# Patient Record
Sex: Male | Born: 1960 | Race: White | Hispanic: No | Marital: Married | Smoking: Never smoker
Health system: Southern US, Community
[De-identification: ages and names within clinical notes are randomized; demographics above are authoritative.]

## PROBLEM LIST (undated history)

## (undated) ENCOUNTER — Emergency Department (HOSPITAL_BASED_OUTPATIENT_CLINIC_OR_DEPARTMENT_OTHER): Admission: EM | Payer: Self-pay | Source: Home / Self Care

## (undated) DIAGNOSIS — M25569 Pain in unspecified knee: Secondary | ICD-10-CM

## (undated) DIAGNOSIS — N200 Calculus of kidney: Secondary | ICD-10-CM

## (undated) DIAGNOSIS — R350 Frequency of micturition: Secondary | ICD-10-CM

## (undated) DIAGNOSIS — M79609 Pain in unspecified limb: Secondary | ICD-10-CM

## (undated) DIAGNOSIS — G4730 Sleep apnea, unspecified: Secondary | ICD-10-CM

## (undated) DIAGNOSIS — M67979 Unspecified disorder of synovium and tendon, unspecified ankle and foot: Secondary | ICD-10-CM

## (undated) DIAGNOSIS — E785 Hyperlipidemia, unspecified: Secondary | ICD-10-CM

## (undated) DIAGNOSIS — Z87442 Personal history of urinary calculi: Secondary | ICD-10-CM

## (undated) DIAGNOSIS — Z8719 Personal history of other diseases of the digestive system: Secondary | ICD-10-CM

## (undated) DIAGNOSIS — I499 Cardiac arrhythmia, unspecified: Secondary | ICD-10-CM

## (undated) DIAGNOSIS — I10 Essential (primary) hypertension: Secondary | ICD-10-CM

## (undated) HISTORY — PX: ANKLE SURGERY: SHX546

## (undated) HISTORY — DX: Pain in unspecified knee: M25.569

## (undated) HISTORY — DX: Frequency of micturition: R35.0

## (undated) HISTORY — DX: Pain in unspecified limb: M79.609

## (undated) HISTORY — DX: Hyperlipidemia, unspecified: E78.5

## (undated) HISTORY — DX: Calculus of kidney: N20.0

## (undated) HISTORY — PX: REPLACEMENT TOTAL KNEE BILATERAL: SUR1225

## (undated) HISTORY — DX: Cardiac arrhythmia, unspecified: I49.9

---

## 1998-11-09 ENCOUNTER — Ambulatory Visit (HOSPITAL_COMMUNITY): Admission: RE | Admit: 1998-11-09 | Discharge: 1998-11-09 | Payer: Self-pay | Admitting: Physical Therapy

## 1998-12-18 HISTORY — PX: HERNIA REPAIR: SHX51

## 2000-12-18 HISTORY — PX: LITHOTRIPSY: SUR834

## 2000-12-18 HISTORY — PX: KNEE ARTHROSCOPY W/ ACL RECONSTRUCTION: SHX1858

## 2002-01-14 ENCOUNTER — Ambulatory Visit (HOSPITAL_BASED_OUTPATIENT_CLINIC_OR_DEPARTMENT_OTHER): Admission: RE | Admit: 2002-01-14 | Discharge: 2002-01-15 | Payer: Self-pay | Admitting: Orthopaedic Surgery

## 2002-02-10 ENCOUNTER — Ambulatory Visit (HOSPITAL_COMMUNITY): Admission: RE | Admit: 2002-02-10 | Discharge: 2002-02-10 | Payer: Self-pay | Admitting: Gastroenterology

## 2003-05-06 ENCOUNTER — Ambulatory Visit (HOSPITAL_COMMUNITY): Admission: AD | Admit: 2003-05-06 | Discharge: 2003-05-06 | Payer: Self-pay | Admitting: Urology

## 2011-06-20 ENCOUNTER — Encounter: Payer: Self-pay | Admitting: Vascular Surgery

## 2011-06-27 ENCOUNTER — Encounter (INDEPENDENT_AMBULATORY_CARE_PROVIDER_SITE_OTHER): Payer: PRIVATE HEALTH INSURANCE

## 2011-06-27 ENCOUNTER — Encounter (INDEPENDENT_AMBULATORY_CARE_PROVIDER_SITE_OTHER): Payer: PRIVATE HEALTH INSURANCE | Admitting: Vascular Surgery

## 2011-06-27 DIAGNOSIS — M79609 Pain in unspecified limb: Secondary | ICD-10-CM

## 2011-06-27 DIAGNOSIS — I8310 Varicose veins of unspecified lower extremity with inflammation: Secondary | ICD-10-CM

## 2011-06-27 DIAGNOSIS — I83893 Varicose veins of bilateral lower extremities with other complications: Secondary | ICD-10-CM

## 2011-06-27 NOTE — Consult Note (Signed)
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  He now complains of severe aching, throbbing, burning discomfort in the thigh and extending down the calf which worsens the longer he is on his feet.  These symptoms have become quite severe and are limiting his ability to do things with his kids and to be on his feet for long periods of time.  He has swelling as the day progresses as noted by a band just above his sock line but he has no history of thrombophlebitis or deep vein thrombosis, bleeding or ulceration.  He has not worn elastic compression stockings, tried elevation of the legs or taking pain medication.  CHRONIC MEDICAL PROBLEMS:  Which are stable: 1. Hyperlipidemia. 2. Renal calculi. 3. History of ACL repair left leg. 4. Possible sleep apnea. 5. Negative for coronary artery disease, diabetes, COPD or stroke.  SOCIAL HISTORY:  He is married.  He is a Psychologist, sport and exercise.  Does not use tobacco.  Drinks occasional alcohol.  FAMILY HISTORY:  Positive for emphysema in his father who was a heavy smoker.  Negative for coronary artery disease, diabetes and stroke.  REVIEW OF SYSTEMS:  Positive for headaches, knee pain, leg discomfort with walking, occasional palpitations, has occasional intermittent chest discomfort, has been seen by Dr. Corliss Marcus in the past, ADD, reflux esophagitis and urinary frequency.  All other systems are negative in complete review of systems.  PHYSICAL EXAMINATION:  Vital signs:  Blood pressure 142/108, heart rate 74, respiratory rate 24.  General:  He is a well-developed, well- nourished male in no apparent distress, alert and oriented x3.  HEENT: Exam normal for age.  EOMs intact.  Lungs:  Clear to  auscultation.  No rhonchi or wheezing.  Cardiovascular:  Regular rhythm.  No murmurs. Abdomen:  Obese.  No palpable masses.  Musculoskeletal:  Free of major deformities.  Neurological:  Normal.  Skin:  Free of rashes.  Lower extremities:  Exam reveals 3+ femoral and dorsalis pedis pulses bilaterally.  Left leg has bulging varicosities in the distal thigh extending into the medial calf and the posterior calf and also in the lower third of the calf of the great saphenous system.  He has 1+ edema bilaterally.  There are a few small varicosities on the right leg in the calf area of the great saphenous system.  Today I ordered bilateral venous duplex exam which I reviewed and interpreted.  He has gross reflux in the left great saphenous system from the junction to the knee supplying these varicosities.  The right leg has a small area of reflux in the mid thigh.  There is some reflux in the deep system bilaterally.  This patient has symptomatic varicose veins particularly in his left leg which are affecting his daily living and ability to work and interact with his kids.  We will treat him initially with long-leg elastic compression stockings (20 mm - 30 mm gradient) as well as elevation and ibuprofen.  He will return in 3 months.  If there has not been significant improvement we should proceed with laser ablation of his left great saphenous  vein with greater than 20 stab phlebectomies.    Quita Skye Hart [place], M.D. Electronically Signed  JDL/MEDQ  D:  06/27/2011  T:  06/27/2011  Job:  [id number]  cc:   Caryn Bee L. Little, M.D.

## 2011-07-05 NOTE — Procedures (Unsigned)
LOWER EXTREMITY VENOUS REFLUX EXAM  INDICATION:  Bilateral varicose veins.  EXAM:  Using color-flow imaging and pulse Doppler spectral analysis, the right and left common femoral, superficial femoral, popliteal, posterior tibial, greater and lesser saphenous veins are evaluated.  There is evidence suggesting deep venous insufficiency in the right and left lower extremity.  The left saphenofemoral junction is not competent with Reflux of >513milliseconds. The right saphenofemoral junction is competent.  The right and left GSV's are not competent with Reflux of >575milliseconds with the caliber as described below.)  The right and left proximal short saphenous veins demonstrate competency.  GSV Diameter (used if found to be incompetent only)                                                  Right     Left Proximal Greater Saphenous Vein                  0.49 cm   0.49 cm Proximal-to-mid-thigh                            0.39 cm   0.54 cm Mid thigh                                        0.37 cm   0.52 cm Mid-distal thigh Distal thigh                                     0.40 cm   0.71 cm Knee                                             0.37 cm   0.47 cm  IMPRESSION: 1. The right and left greater saphenous veins are not competent with     reflux >580milliseconds. 2. The right greater saphenous vein is tortuous.  The left greater     saphenous vein is not tortuous. 3. The deep venous system is not competent with Reflux of     >555milliseconds. 4. The right and left small saphenous vein is competent.  ___________________________________________ Quita Skye. Hart [place], M.D.  EM/MEDQ  D:  06/27/2011  T:  06/27/2011  Job:  [id number]

## 2011-08-02 ENCOUNTER — Encounter: Payer: Self-pay | Admitting: Cardiovascular Disease

## 2011-08-17 ENCOUNTER — Encounter: Payer: Self-pay | Admitting: Vascular Surgery

## 2011-09-22 ENCOUNTER — Encounter: Payer: Self-pay | Admitting: Vascular Surgery

## 2011-10-02 ENCOUNTER — Ambulatory Visit: Payer: PRIVATE HEALTH INSURANCE | Admitting: Vascular Surgery

## 2011-10-09 ENCOUNTER — Ambulatory Visit (INDEPENDENT_AMBULATORY_CARE_PROVIDER_SITE_OTHER): Payer: PRIVATE HEALTH INSURANCE | Admitting: Vascular Surgery

## 2011-10-09 ENCOUNTER — Encounter: Payer: Self-pay | Admitting: Vascular Surgery

## 2011-10-09 VITALS — BP 149/101 | HR 89 | Resp 16 | Ht 71.0 in | Wt 281.5 lb

## 2011-10-09 DIAGNOSIS — I83893 Varicose veins of bilateral lower extremities with other complications: Secondary | ICD-10-CM

## 2011-10-09 NOTE — Progress Notes (Signed)
Subjective:     Patient ID: Devon Griffith, male   DOB: 01-May-1961, 50 y.o.   MRN: [id number]  HPI this 50 year old male patient returns today for further followup regarding his severe venous insufficiency of the left leg. He has bulging varicosities which cause aching throbbing and burning discomfort these symptoms have not been relieved by long leg elastic compression stockings, elevation, and ibuprofen on a daily basis. He continues to have swelling in the left ankle and lower calf as the day progresses. He has no history of DVT, thrombophlebitis, stasis ulcers, or bleeding. His symptoms continued to worsen and make it more difficult for him to perform his daily activities.  Past Medical History  Diagnosis Date  . Hyperlipidemia   . Headaches, cluster   . Knee pain   . Pain in limb   . GERD (gastroesophageal reflux disease)   . Urinary frequency   . Kidney stones   . ADD (attention deficit disorder)     History  Substance Use Topics  . Smoking status: Never Smoker   . Smokeless tobacco: Not on file  . Alcohol Use: 4.0 oz/week    8 drink(s) per week    Family History  Problem Relation Age of Onset  . Emphysema Father   . Cancer Mother     No Known Allergies  Current outpatient prescriptions:diltiazem (CARDIZEM CD) 120 MG 24 hr capsule, Take 120 mg by mouth daily.  , Disp: , Rfl: ;  simvastatin (ZOCOR) 20 MG tablet, Take 40 mg by mouth at bedtime.  , Disp: , Rfl:   BP 149/101  Pulse 89  Resp 16  Ht 5\' 11"  (1.803 m)  Wt 281 lb 8 oz (127.688 kg)  BMI 39.26 kg/m2  Body mass index is 39.26 kg/(m^2).        Review of Systems he denies chest pain, dyspnea on exertion, PND, orthopnea, hemoptysis, or claudication. All other systems are negative and complete review of systems    Objective:   Physical Exam blood pressure 149/101 heart rate 89 respirations 16 HEENT normal for age Lungs no rhonchi or wheezing Abdomen soft nontender with no masses Lower extremity has 3+  femoral dorsalis pedis pulse palpable. There is 1+ edema. There are bulging varicosities in the distal thigh and medial calf extending slightly posteriorly in the distal thigh. No hyperpigmentation or ulceration noted.    Assessment:    severe venous insufficiency left leg with gross reflux left great saphenous vein causing venous hypertension and symptomatic varicosities. His are affecting his daily living .    Plan:    patient needs laser ablation of left great saphenous vein with greater than 20 stab phlebectomy at same procedure. Has not been improved by conservative regimen we'll proceed with precertification to perform this in the near future.

## 2011-11-08 ENCOUNTER — Other Ambulatory Visit: Payer: Self-pay | Admitting: Family Medicine

## 2011-11-08 ENCOUNTER — Ambulatory Visit
Admission: RE | Admit: 2011-11-08 | Discharge: 2011-11-08 | Disposition: A | Discharge status: Home / Self Care | Payer: BC Managed Care – PPO | Source: Ambulatory Visit | Attending: Family Medicine | Admitting: Family Medicine

## 2011-11-08 DIAGNOSIS — R109 Unspecified abdominal pain: Secondary | ICD-10-CM

## 2011-11-14 ENCOUNTER — Other Ambulatory Visit: Payer: Self-pay | Admitting: Urology

## 2011-11-28 ENCOUNTER — Encounter (HOSPITAL_COMMUNITY): Payer: Self-pay | Admitting: *Deleted

## 2011-11-28 NOTE — Progress Notes (Signed)
Pt instructed to come to [facility] short stay at 0800 on 11/30/11. Instructed in free valet parking. No aspirin or NSAIDS. Pt takes all home meds in evening; therefore, no meds required AM of procedure. No vitamins or herbs. Fill out blue folder and bring the day of the procedure. Follow instructions for laxative the day before the procedure. To bring the day of procedure: blue folder, insurance card, photo ID, and responsible driver. Pt knows he has to have responsible person with him for 24 hours after procedure.

## 2011-11-29 ENCOUNTER — Encounter (HOSPITAL_COMMUNITY): Payer: Self-pay | Admitting: Pharmacy Technician

## 2011-11-29 ENCOUNTER — Other Ambulatory Visit (HOSPITAL_COMMUNITY): Payer: Self-pay | Admitting: *Deleted

## 2011-11-30 ENCOUNTER — Encounter (HOSPITAL_COMMUNITY)
Admission: RE | Disposition: A | Discharge status: Home / Self Care | Payer: Self-pay | Source: Ambulatory Visit | Attending: Urology

## 2011-11-30 ENCOUNTER — Encounter (HOSPITAL_COMMUNITY): Payer: Self-pay | Admitting: *Deleted

## 2011-11-30 ENCOUNTER — Ambulatory Visit (HOSPITAL_COMMUNITY): Payer: BC Managed Care – PPO

## 2011-11-30 ENCOUNTER — Ambulatory Visit (HOSPITAL_COMMUNITY)
Admission: RE | Admit: 2011-11-30 | Discharge: 2011-11-30 | Disposition: A | Discharge status: Home / Self Care | Payer: BC Managed Care – PPO | Source: Ambulatory Visit | Attending: Urology | Admitting: Urology

## 2011-11-30 DIAGNOSIS — K219 Gastro-esophageal reflux disease without esophagitis: Secondary | ICD-10-CM | POA: Insufficient documentation

## 2011-11-30 DIAGNOSIS — I10 Essential (primary) hypertension: Secondary | ICD-10-CM | POA: Insufficient documentation

## 2011-11-30 DIAGNOSIS — Z79899 Other long term (current) drug therapy: Secondary | ICD-10-CM | POA: Insufficient documentation

## 2011-11-30 DIAGNOSIS — R3915 Urgency of urination: Secondary | ICD-10-CM | POA: Insufficient documentation

## 2011-11-30 DIAGNOSIS — N201 Calculus of ureter: Secondary | ICD-10-CM | POA: Insufficient documentation

## 2011-11-30 DIAGNOSIS — E785 Hyperlipidemia, unspecified: Secondary | ICD-10-CM | POA: Insufficient documentation

## 2011-11-30 DIAGNOSIS — R112 Nausea with vomiting, unspecified: Secondary | ICD-10-CM | POA: Insufficient documentation

## 2011-11-30 DIAGNOSIS — R350 Frequency of micturition: Secondary | ICD-10-CM | POA: Insufficient documentation

## 2011-11-30 DIAGNOSIS — Z538 Procedure and treatment not carried out for other reasons: Secondary | ICD-10-CM | POA: Insufficient documentation

## 2011-11-30 DIAGNOSIS — G4730 Sleep apnea, unspecified: Secondary | ICD-10-CM | POA: Insufficient documentation

## 2011-11-30 DIAGNOSIS — R109 Unspecified abdominal pain: Secondary | ICD-10-CM | POA: Insufficient documentation

## 2011-11-30 SURGERY — LITHOTRIPSY, ESWL
Anesthesia: LOCAL | Laterality: Right

## 2011-11-30 MED ORDER — DIPHENHYDRAMINE HCL 25 MG PO CAPS
25.0000 mg | ORAL_CAPSULE | ORAL | Status: AC
  Administered 2011-11-30: 25 mg via ORAL

## 2011-11-30 MED ORDER — DEXTROSE-NACL 5-0.45 % IV SOLN
INTRAVENOUS | Status: DC
  Administered 2011-11-30: 09:00:00 via INTRAVENOUS

## 2011-11-30 MED ORDER — CIPROFLOXACIN HCL 500 MG PO TABS
500.0000 mg | ORAL_TABLET | ORAL | Status: AC
  Administered 2011-11-30: 500 mg via ORAL

## 2011-11-30 MED ORDER — DIAZEPAM 5 MG PO TABS
10.0000 mg | ORAL_TABLET | ORAL | Status: AC
  Administered 2011-11-30: 10 mg via ORAL

## 2011-11-30 NOTE — Progress Notes (Signed)
To Litho truck at this time

## 2011-11-30 NOTE — Op Note (Signed)
Pre-operative diagnosis : R ureteral stone  Postoperative diagnosis: passed R ureteral stone  Operation: Discontinued lithotripsy, unable to visualize stone, with IV contrast  Surgeon: Kathyann Spaugh  Anesthesia: pre-op sedation  Preparation: After pre-med, the pt is brought to the lithotripter, and placed upon the table in the supine position. CT is reviewed and KUB from today is reviewed.  Review history: 3 week hx of 6mm stone found on CT, and scheduled for lithotripsy. Resolved renal colic. CT and KUB reviewed.   Statement of  Likelihood of Success: Excellent. TIME-OUT observed.:  Procedure: Stone not identified on KUB today, and CT reviewed. Note clinical symptoms resolved, although pt has not strained urine, and has not noted stone passage. IV contrast given, and contrast reveals normal calabre ureter. No stone is seen. Contrast seen from the calyces to the bladder.    Procedure therefore stopped.

## 2011-11-30 NOTE — Progress Notes (Signed)
Litho aborted unable to view stone . D/c to

## 2011-11-30 NOTE — H&P (Signed)
Urology Admission H&P  Chief Complaint: R flank pain  History of Present Illness: 3 week hx of R flank pain , intermittant severe nausea and vomiting. CT shows R proximal mm stone.Nocturia x 8 with constant urge.  Past Medical History  Diagnosis Date  . Hyperlipidemia   . Knee pain   . Pain in limb   . GERD (gastroesophageal reflux disease)   . Urinary frequency   . Kidney stones   . ADD (attention deficit disorder)   . Headaches, cluster    Past Surgical History  Procedure Date  . Hernia repair 2000  . Knee arthroscopy w/ acl reconstruction 2002    left knee  . Lithotripsy 2002    Home Medications:  Prescriptions prior to admission  Medication Sig Dispense Refill  . HYDROcodone-acetaminophen (NORCO) 5-325 MG per tablet Take 1 tablet by mouth every 4 (four) hours as needed. PAIN       . diltiazem (CARDIZEM CD) 120 MG 24 hr capsule Take 120 mg by mouth every morning.       . simvastatin (ZOCOR) 20 MG tablet Take 20 mg by mouth at bedtime.        Allergies: No Known Allergies  Family History  Problem Relation Age of Onset  . Emphysema Father   . Cancer Mother    Social History:  reports that he has never smoked. He does not have any smokeless tobacco history on file. He reports that he drinks about 4 ounces of alcohol per week. He reports that he does not use illicit drugs.  Review of Systems  Constitutional: Negative.   HENT: Negative.   Eyes: Negative.   Respiratory: Negative.   Cardiovascular: Negative.   Gastrointestinal: Positive for nausea and vomiting. Negative for heartburn, abdominal pain, diarrhea, constipation and melena.  Genitourinary: Positive for urgency, frequency and flank pain. Negative for dysuria and hematuria.  Skin: Negative.   Neurological: Negative.   Endo/Heme/Allergies: Negative.   Psychiatric/Behavioral: Negative.     Physical Exam:  Vital signs in last 24 hours: Temp:  [97.3 F (36.3 C)] 97.3 F (36.3 C) (12/13 0814) Pulse Rate:   [71] 71  (12/13 0814) Resp:  [18] 18  (12/13 0814) BP: (138)/(96) 138/96 mmHg (12/13 0814) SpO2:  [94 %] 94 % (12/13 0814) Weight:  [124.739 kg (275 lb)] 275 lb (124.739 kg) (12/13 1610) Physical Exam  Constitutional: He appears well-developed and well-nourished.  GI: Soft. Bowel sounds are normal. There is no splenomegaly or hepatomegaly. There is generalized tenderness. There is CVA tenderness. There is no rigidity, no rebound, no guarding, no tenderness at McBurney's point and negative Murphy's sign. No hernia. Hernia confirmed negative in the ventral area, confirmed negative in the right inguinal area and confirmed negative in the left inguinal area.  Genitourinary: Testes normal and penis normal.    Laboratory Data:  No results found for this or any previous visit (from the past 24 hour(s)). No results found for this or any previous visit (from the past 240 hour(s)). Creatinine: No results found for this basename: CREATININE:7 in the last 168 hours Baseline Creatinine:   Impression/Assessment:  Right upper ureteral stone with colic  Plan:  Lithotripsy R ureteral stone  Hillel Card I 11/30/2011, 10:11 AM

## 2011-12-19 HISTORY — PX: VARICOSE VEIN SURGERY: SHX832

## 2012-01-29 ENCOUNTER — Other Ambulatory Visit: Payer: Self-pay | Admitting: *Deleted

## 2012-01-29 DIAGNOSIS — I83893 Varicose veins of bilateral lower extremities with other complications: Secondary | ICD-10-CM

## 2012-02-26 ENCOUNTER — Other Ambulatory Visit: Payer: BC Managed Care – PPO | Admitting: Vascular Surgery

## 2012-03-01 ENCOUNTER — Encounter: Payer: Self-pay | Admitting: Vascular Surgery

## 2012-03-04 ENCOUNTER — Encounter: Payer: Self-pay | Admitting: Vascular Surgery

## 2012-03-04 ENCOUNTER — Ambulatory Visit (INDEPENDENT_AMBULATORY_CARE_PROVIDER_SITE_OTHER): Payer: BC Managed Care – PPO | Admitting: Vascular Surgery

## 2012-03-04 VITALS — BP 128/90 | HR 67 | Resp 18 | Ht 71.0 in | Wt 268.0 lb

## 2012-03-04 DIAGNOSIS — I83893 Varicose veins of bilateral lower extremities with other complications: Secondary | ICD-10-CM | POA: Insufficient documentation

## 2012-03-04 NOTE — Progress Notes (Signed)
Subjective:     Patient ID: Devon Griffith, male   DOB: [birth year], 51 y.o.   MRN: [id number]  HPI this 51 year old male with laser ablation of left great saphenous vein from the knee to the saphenofemoral junction under local tumescent anesthesia and greater than 20 stab phlebectomy for secondary varicosities. He tolerated the procedure well.  Review of Systems     Objective:   Physical Exam BP 128/90  Pulse 67  Resp 18  Ht 5\' 11"  (1.803 m)  Wt 268 lb (121.564 kg)  BMI 37.38 kg/m2    Assessment:     Well-tolerated laser ablation left great saphenous vein with greater than 20 stab phlebectomy for painful varicosities    Plan:     Return in one week for venous duplex exam to confirm closure left great saphenous vein.

## 2012-03-04 NOTE — Progress Notes (Signed)
Laser Ablation Procedure      Date: 03/04/2012    Devon Griffith DOB:[birth year]  Consent signed: Yes  Surgeon:J.D. Hart Rochester  Procedure: Laser Ablation: left Greater Saphenous Vein  BP 128/90  Pulse 67  Resp 18  Ht 5\' 11"  (1.803 m)  Wt 268 lb (121.564 kg)  BMI 37.38 kg/m2  Start time: 9:00   End time: 10:20  Tumescent Anesthesia: 425 cc 0.9% NaCl with 50 cc Lidocaine HCL with 1% Epi and 15 cc 8.4% NaHCO3  Local Anesthesia: 14 cc Lidocaine HCL and NaHCO3 (ratio 2:1)  Pulsed mode: Watts 15 Seconds 1 Pulses:1 Total Pulses:126 Total Energy: 1890 Total Time: 2:06   Stab Phlebectomy: >20 Sites: Thigh, Calf and Ankle  Patient tolerated procedure well: Yes  Description of Procedure:  After marking the course of the saphenous vein and the secondary varicosities in the standing position, the patient was placed on the operating table in the supine position, and the left leg was prepped and draped in sterile fashion. Local anesthetic was administered, and under ultrasound guidance the saphenous vein was accessed with a micro needle and guide wire; then the micro puncture sheath was placed. A guide wire was inserted to the saphenofemoral junction, followed by a 5 french sheath.  The position of the sheath and then the laser fiber below the junction was confirmed using the ultrasound and visualization of the aiming beam.  Tumescent anesthesia was administered along the course of the saphenous vein using ultrasound guidance. Protective laser glasses were placed on the patient, and the laser was fired at 15 watt pulsed mode advancing 1-2 mm per sec.  For a total of 1890 joules.  A steri strip was applied to the puncture site.  The patient was then put into Trendelenburg position.  Local anesthetic was utilized overlying the marked varicosities.  Greater than 20 stab wounds were made using the tip of an 11 blade; and using the vein hook,  The phlebectomies were performed using a hemostat to avulse these  varicosities.  Adequate hemostasis was achieved, and steri strips were applied to the stab wound.    ABD pads and thigh high compression stockings were applied.  Ace wrap bandages were applied over the phlebectomy sites and at the top of the saphenofemoral junction.  Blood loss was less than 15 cc.  The patient ambulated out of the operating room having tolerated the procedure well.

## 2012-03-05 ENCOUNTER — Encounter: Payer: Self-pay | Admitting: Vascular Surgery

## 2012-03-05 ENCOUNTER — Telehealth: Payer: Self-pay | Admitting: *Deleted

## 2012-03-05 ENCOUNTER — Ambulatory Visit: Payer: BC Managed Care – PPO | Admitting: Vascular Surgery

## 2012-03-05 NOTE — Telephone Encounter (Signed)
Patient doing well. No bleeding. No complaints of pain just that the ace wraps are tight. Discussed those and reminded him of his fu visit next week.

## 2012-03-11 ENCOUNTER — Encounter: Payer: Self-pay | Admitting: Vascular Surgery

## 2012-03-12 ENCOUNTER — Ambulatory Visit (INDEPENDENT_AMBULATORY_CARE_PROVIDER_SITE_OTHER): Payer: BC Managed Care – PPO | Admitting: Vascular Surgery

## 2012-03-12 ENCOUNTER — Encounter (INDEPENDENT_AMBULATORY_CARE_PROVIDER_SITE_OTHER): Payer: BC Managed Care – PPO | Admitting: *Deleted

## 2012-03-12 ENCOUNTER — Encounter: Payer: Self-pay | Admitting: Vascular Surgery

## 2012-03-12 VITALS — BP 123/85 | HR 74 | Resp 16 | Ht 71.0 in | Wt 268.0 lb

## 2012-03-12 DIAGNOSIS — I83893 Varicose veins of bilateral lower extremities with other complications: Secondary | ICD-10-CM

## 2012-03-12 DIAGNOSIS — Z48812 Encounter for surgical aftercare following surgery on the circulatory system: Secondary | ICD-10-CM

## 2012-03-12 NOTE — Progress Notes (Signed)
Subjective:     Patient ID: Devon Griffith, male   DOB: [birth year], 51 y.o.   MRN: [id number]  HPI this 51 year old male returns 1 week post laser ablation left great saphenous vein with greater than 20 stab phlebectomy for painful varicosities do 2 valvular incompetence and reflux in the left great saphenous system. He tolerated the procedure well. He said some mild-to-moderate discomfort along the course of the great saphenous vein particularly in the proximal thigh as one would expect. He said no distal edema in no pain at the stab phlebectomy sites. He took ibuprofen and work elastic compression stocking.  Review of Systems     Objective:   Physical ExamBP 123/85  Pulse 74  Resp 16  Ht 5\' 11"  (1.803 m)  Wt 268 lb (121.564 kg)  BMI 37.38 kg/m2  General well-developed well-nourished male in no apparent stress Left leg with minimal erythema along the course of the great saphenous vein in the thigh. Mild tenderness along the great saphenous vein up to the saphenofemoral junction. No distal edema noted. Steps phlebectomy sites are healing well.   Today I ordered a venous duplex exam of the left leg which are reviewed and interpreted. There is successful closure left great saphenous vein from 2 cm to the junction throughout the leg. No DVT is noted.     Assessment:     Successful ablation left great saphenous vein with multiple stab phlebectomy for painful varicosities    Plan:     Will wear elastic compression stockings for one more week and then return to see [place] on when necessary basis

## 2012-03-19 NOTE — Procedures (Unsigned)
DUPLEX DEEP VENOUS EXAM - LOWER EXTREMITY  INDICATION:  One week status post ablation of left GSV  HISTORY:  Edema:  No Trauma/Surgery:  EVLT Pain:  Yes PE:  No Previous DVT:  No Anticoagulants: Other:  DUPLEX EXAM:               CFV   SFV   PopV  PTV    GSV               R  L  R  L  R  L  R   L  R  L Thrombosis       o     o     o      o     + Spontaneous      +     +     +            o Phasic           +     +     +            o Augmentation     +     +     +      +     o Compressible     +     +     +      +     o Competent  Legend:  + - yes  o - no  p - partial  D - decreased  IMPRESSION: 1. Successful ablation of left great saphenous vein approximately 1.9     cm distal to the junction through the insertion point. 2. No deep vein involvement was observed.   _____________________________ Quita Skye. Hart [place], M.D.  LT/MEDQ  D:  03/12/2012  T:  03/12/2012  Job:  [id number]

## 2012-03-29 ENCOUNTER — Other Ambulatory Visit: Payer: Self-pay | Admitting: Urology

## 2012-03-29 ENCOUNTER — Encounter (HOSPITAL_COMMUNITY): Payer: Self-pay | Admitting: *Deleted

## 2012-03-29 NOTE — H&P (Signed)
History of Present Illness     51 y/o white male with h/o nephrolithiasis presents for acute evaluation of RLQ abdominal pain and diffisulty voiding.  He was scheduled for Rt ESWL on 11/30/11 for right proximal ureteral stone, but stone could not be visualized.  The stone was not visualized on f/u KUB in the office 12/07/12 and he had not had further pains so it was felt that he had likely passed the stone.  He had been getting along well until Monday 03/25/12 when he developed N/V, diarrhea and diffuse abdominal discomfort.  His wife had just gotten over a GI bug so he assumed that he had the same.  That evening, he felt lousy and had some low grade fever and chills.  He did not have any further N/V/D and the fever/chills had resolved by morning.  He has not had further fever, chills, N/V/D but has continued with anorexia and RLQ abdominal discomfort.  He has also noticed a weaker FOS, hesitancy despite urgency and some burning in his penis occasionally.  No dysuria or gross hematuria.    He has had prior ESWL x 3.  He has had a couple of stents as well but back in the 1990's.  He had a right ureterolithotomy at age 62.  He has had basket extractions as well.     Past Medical History Problems  1. History of  Adult Sleep Apnea 780.57 2. History of  Allergic Rhinitis 477.9 3. History of  Dyslipidemia 272.4 4. History of  Hypertension 401.9 5. History of  Nephrolithiasis V13.01 6. History of  Nephrolithiasis V13.01 7. History of  Obstructive Sleep Apnea 327.23  Surgical History Problems  1. History of  Complete Colonoscopy 2. History of  Cystoscopy With Insertion Of Ureteral Stent 3. History of  Cystoscopy With Manipulation Of Ureteral Calculus 4. History of  Cystoscopy With Ureteroscopy 5. History of  Inguinal Hernia Repair Bilateral 6. History of  Kidney Surgery 7. History of  Lithotripsy 8. History of  Primary Repair Of Knee Ligament Cruciate Anterior 9. History of  Primary Repair Of Knee  Ligament Cruciate Anterior 10. History of  Varicose Vein Ligation Left  Current Meds 1. Diltiazem HCl 60 MG Oral Tablet; Therapy: 26Nov2012 to 2. Simvastatin TABS; Therapy: (Recorded:26Nov2012) to 3. Uribel 118 MG Oral Capsule; Therapy: (Recorded:21Dec2012) to 4. VESIcare TABS; Therapy: (Recorded:21Dec2012) to  Allergies Medication  1. No Known Drug Allergies  Family History Problems  1. Maternal history of  Breast Cancer V16.3 2. Family history of  Death In The Family Father Died age 38 Emphysema 3. Family history of  Death In The Family Mother Died cancer of thyroid age 70 3. Paternal history of  Emphysema 5. Family history of  Family Health Status Number Of Children 2 sosn 6. Maternal history of  Lung Cancer V16.1 7. Maternal history of  Metastasis To Liver 8. Maternal history of  Thyroid Cancer  Social History Problems  1. Being A Social Drinker less than 1 per day 2. Caffeine Use 2 a week 3. Marital History - Currently Married 4. 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 History of  Tobacco Use 305.1  Review of Systems Genitourinary, constitutional, skin, eye, otolaryngeal, hematologic/lymphatic, cardiovascular, pulmonary, endocrine, musculoskeletal, gastrointestinal, neurological and psychiatric system(s) were reviewed and pertinent findings if present are noted.  Genitourinary: urinary frequency, feelings of urinary urgency, difficulty starting the urinary stream, weak urinary stream and penile pain.  Gastrointestinal: nausea and abdominal pain.    Vitals  Vital Signs [Data Includes: Last 1 Day]  12Apr2013 09:20AM  Blood Pressure: 119 / 86 Temperature: 98.4 F Heart Rate: 71  Physical Exam Constitutional: Well nourished and well developed . No acute distress.  ENT:. The ears and nose are normal in appearance.  Neck: The appearance of the neck is normal and no neck mass is present.  Pulmonary: No respiratory distress and normal respiratory rhythm  and effort.  Cardiovascular:. No peripheral edema.  Abdomen: The abdomen is not distended. The abdomen is soft and nontender. No CVA tenderness.  Skin: Normal skin turgor, no visible rash and no visible skin lesions.  Neuro/Psych:. Mood and affect are appropriate.    Results/Data Urine [Data Includes: Last 1 Day]   12Apr2013  COLOR GREEN   APPEARANCE CLEAR   SPECIFIC GRAVITY 1.025   pH 5.5   GLUCOSE NEG mg/dL  BILIRUBIN SMALL   KETONE NEG mg/dL  BLOOD LARGE   PROTEIN TRACE mg/dL  UROBILINOGEN 0.2 mg/dL  NITRITE NEG   LEUKOCYTE ESTERASE NEG   SQUAMOUS EPITHELIAL/HPF RARE   WBC 4-6 WBC/hpf  RBC 0-3 RBC/hpf  BACTERIA FEW   CRYSTALS NONE SEEN   CASTS Hyaline casts noted   Other Mucus noted.    The following images/tracing/specimen were independently visualized: .     CTU demonstrates the previously noted  6x15mm right proximal ureteral stone now present in the right distal ureter at the UVJ with mild hydroureteronephrosis.  No other urolithiasis noted.  The following clinical lab reports were reviewed: Marland Kitchen  Urinalysis Selected Results  UA With REFLEX 12Apr2013 09:07AM Bruning, Ashlyn   Test Name Result Flag Reference  COLOR GREEN A YELLOW  Biochemicals may be affected by the color of the urine.  APPEARANCE CLEAR  CLEAR  SPECIFIC GRAVITY 1.025  1.005-1.030  pH 5.5  5.0-8.0  GLUCOSE NEG mg/dL  NEG  BILIRUBIN SMALL A NEG  KETONE NEG mg/dL  NEG  BLOOD LARGE A NEG  PROTEIN TRACE mg/dL  NEG  UROBILINOGEN 0.2 mg/dL  1.6-1.0  NITRITE NEG  NEG  LEUKOCYTE ESTERASE NEG  NEG  SQUAMOUS EPITHELIAL/HPF RARE  RARE  WBC 4-6 WBC/hpf A <4  RBC 0-3 RBC/hpf  <4  BACTERIA FEW A RARE  CRYSTALS NONE SEEN  NONE SEEN  CASTS Hyaline casts noted  NONE SEEN  Other Mucus noted.     Assessment Assessed  1. Ureteral Stone Right 592.1 2. Dysuria 788.1 3. Diffuse Abdominal Pain 789.07  Plan Diffuse Abdominal Pain (789.07)  1. AU CT-STONE PROTOCOL  Done: 12Apr2013 12:00AM Feelings Of Urinary  Urgency (788.63), Ureteral Stone (592.1)  2. Follow-up Month x 4 Office  Follow-up  Requested for: Apr 2013 Health Maintenance (V70.0)  3. UA With REFLEX  Done: 12Apr2013 09:07AM Ureteral Stone (592.1)  4. Oxycodone-Acetaminophen 5-325 MG Oral Tablet; 1-2 every 6 hrs. as needed for pain; Therapy:  12Apr2013 to (Last Rx:12Apr2013) 5. Tamsulosin HCl 0.4 MG Oral Capsule; TAKE 1 CAPSULE Daily to facilitate stone passage;  Therapy: 12Apr2013 to (Evaluate:11Jul2013)  Requested for: 12Apr2013; Last Rx:12Apr2013;  Edited   Treatment options discussed including ESWL vs cystoureteroscopy for stone extraction.  Risks and benefits discussed.  The patient elects to proceed with the latter. Orders completed. Tamsulosin Rx provided to use for MET.  He will strain his urine consistently. Percocet prn pain. He knows to call immediately for fever, chills, uncontrolled pain or N/V.   Signatures

## 2012-04-01 ENCOUNTER — Ambulatory Visit (HOSPITAL_COMMUNITY): Payer: BC Managed Care – PPO | Admitting: *Deleted

## 2012-04-01 ENCOUNTER — Encounter (HOSPITAL_COMMUNITY): Payer: Self-pay | Admitting: *Deleted

## 2012-04-01 ENCOUNTER — Encounter (HOSPITAL_COMMUNITY)
Admission: RE | Disposition: A | Discharge status: Home / Self Care | Payer: Self-pay | Source: Ambulatory Visit | Attending: Urology

## 2012-04-01 ENCOUNTER — Ambulatory Visit (HOSPITAL_COMMUNITY)
Admission: RE | Admit: 2012-04-01 | Discharge: 2012-04-01 | Disposition: A | Discharge status: Home / Self Care | Payer: BC Managed Care – PPO | Source: Ambulatory Visit | Attending: Urology | Admitting: Urology

## 2012-04-01 DIAGNOSIS — G4733 Obstructive sleep apnea (adult) (pediatric): Secondary | ICD-10-CM | POA: Insufficient documentation

## 2012-04-01 DIAGNOSIS — I10 Essential (primary) hypertension: Secondary | ICD-10-CM | POA: Insufficient documentation

## 2012-04-01 DIAGNOSIS — E785 Hyperlipidemia, unspecified: Secondary | ICD-10-CM | POA: Insufficient documentation

## 2012-04-01 DIAGNOSIS — Z79899 Other long term (current) drug therapy: Secondary | ICD-10-CM | POA: Insufficient documentation

## 2012-04-01 DIAGNOSIS — N201 Calculus of ureter: Secondary | ICD-10-CM

## 2012-04-01 HISTORY — DX: Personal history of other diseases of the digestive system: Z87.19

## 2012-04-01 HISTORY — DX: Essential (primary) hypertension: I10

## 2012-04-01 HISTORY — DX: Sleep apnea, unspecified: G47.30

## 2012-04-01 LAB — CBC
HCT: 39.9 % (ref 39.0–52.0)
Hemoglobin: 14.2 g/dL (ref 13.0–17.0)
MCH: 30.2 pg (ref 26.0–34.0)
MCHC: 35.6 g/dL (ref 30.0–36.0)
MCV: 84.9 fL (ref 78.0–100.0)
Platelets: 252 K/uL (ref 150–400)
RBC: 4.7 MIL/uL (ref 4.22–5.81)
RDW: 11.9 % (ref 11.5–15.5)
WBC: 6 K/uL (ref 4.0–10.5)

## 2012-04-01 LAB — BASIC METABOLIC PANEL
BUN: 24 mg/dL — ABNORMAL HIGH (ref 6–23)
CO2: 25 mEq/L (ref 19–32)
Calcium: 9.5 mg/dL (ref 8.4–10.5)
Chloride: 103 mEq/L (ref 96–112)
Creatinine, Ser: 1 mg/dL (ref 0.50–1.35)
GFR calc Af Amer: >90 mL/min (ref >90)
GFR calc non Af Amer: 85 mL/min — ABNORMAL LOW (ref >90)
Glucose, Bld: 95 mg/dL (ref 70–99)
Potassium: 3.6 mEq/L (ref 3.5–5.1)
Sodium: 136 mEq/L (ref 135–145)

## 2012-04-01 LAB — SURGICAL PCR SCREEN
MRSA, PCR: NEGATIVE
Staphylococcus aureus: NEGATIVE

## 2012-04-01 SURGERY — CYSTOURETEROSCOPY, WITH RETROGRADE PYELOGRAM AND STENT INSERTION
Anesthesia: General | Site: Ureter | Laterality: Right | Wound class: Clean Contaminated

## 2012-04-01 MED ORDER — BELLADONNA ALKALOIDS-OPIUM 16.2-60 MG RE SUPP
RECTAL | Status: DC | PRN
  Administered 2012-04-01: 1 via RECTAL

## 2012-04-01 MED ORDER — ACETAMINOPHEN 10 MG/ML IV SOLN
INTRAVENOUS | Status: AC
  Filled 2012-04-01: qty 100

## 2012-04-01 MED ORDER — ONDANSETRON HCL 4 MG/2ML IJ SOLN
INTRAMUSCULAR | Status: DC | PRN
  Administered 2012-04-01: 4 mg via INTRAVENOUS

## 2012-04-01 MED ORDER — LACTATED RINGERS IV SOLN
INTRAVENOUS | Status: DC | PRN
  Administered 2012-04-01 (×2): via INTRAVENOUS

## 2012-04-01 MED ORDER — FENTANYL CITRATE 0.05 MG/ML IJ SOLN
INTRAMUSCULAR | Status: DC | PRN
  Administered 2012-04-01: 50 ug via INTRAVENOUS
  Administered 2012-04-01: 25 ug via INTRAVENOUS
  Administered 2012-04-01 (×2): 50 ug via INTRAVENOUS
  Administered 2012-04-01: 25 ug via INTRAVENOUS

## 2012-04-01 MED ORDER — BELLADONNA ALKALOIDS-OPIUM 16.2-60 MG RE SUPP
RECTAL | Status: AC
  Filled 2012-04-01: qty 1

## 2012-04-01 MED ORDER — CEFAZOLIN SODIUM-DEXTROSE 2-3 GM-% IV SOLR
INTRAVENOUS | Status: AC
  Filled 2012-04-01: qty 50

## 2012-04-01 MED ORDER — MUPIROCIN 2 % EX OINT
TOPICAL_OINTMENT | CUTANEOUS | Status: AC
  Filled 2012-04-01: qty 22

## 2012-04-01 MED ORDER — TRIMETHOPRIM 100 MG PO TABS: 100.0000 mg | ORAL_TABLET | ORAL | Status: AC

## 2012-04-01 MED ORDER — IOHEXOL 300 MG/ML  SOLN
INTRAMUSCULAR | Status: AC
  Filled 2012-04-01: qty 1

## 2012-04-01 MED ORDER — SODIUM CHLORIDE 0.9 % IR SOLN
Status: DC | PRN
  Administered 2012-04-01: 3000 mL

## 2012-04-01 MED ORDER — DEXAMETHASONE SODIUM PHOSPHATE 10 MG/ML IJ SOLN
INTRAMUSCULAR | Status: DC | PRN
  Administered 2012-04-01: 10 mg via INTRAVENOUS

## 2012-04-01 MED ORDER — KETOROLAC TROMETHAMINE 30 MG/ML IJ SOLN
INTRAMUSCULAR | Status: DC | PRN
  Administered 2012-04-01: 30 mg via INTRAVENOUS

## 2012-04-01 MED ORDER — HYDROCODONE-ACETAMINOPHEN 7.5-650 MG PO TABS: 1.0000 | ORAL_TABLET | Freq: Four times a day (QID) | ORAL | Status: AC | PRN

## 2012-04-01 MED ORDER — CEFAZOLIN SODIUM-DEXTROSE 2-3 GM-% IV SOLR
2.0000 g | INTRAVENOUS | Status: AC
  Administered 2012-04-01: 2 g via INTRAVENOUS

## 2012-04-01 MED ORDER — LIDOCAINE HCL (CARDIAC) 20 MG/ML IV SOLN
INTRAVENOUS | Status: DC | PRN
  Administered 2012-04-01: 50 mg via INTRAVENOUS

## 2012-04-01 MED ORDER — ACETAMINOPHEN 10 MG/ML IV SOLN
INTRAVENOUS | Status: DC | PRN
  Administered 2012-04-01: 1000 mg via INTRAVENOUS

## 2012-04-01 MED ORDER — IOHEXOL 300 MG/ML  SOLN
INTRAMUSCULAR | Status: DC | PRN
  Administered 2012-04-01: 10 mL

## 2012-04-01 MED ORDER — MIDAZOLAM HCL 5 MG/5ML IJ SOLN
INTRAMUSCULAR | Status: DC | PRN
  Administered 2012-04-01: 2 mg via INTRAVENOUS

## 2012-04-01 MED ORDER — HYDROMORPHONE HCL PF 1 MG/ML IJ SOLN: 0.2500 mg | INTRAMUSCULAR | Status: DC | PRN

## 2012-04-01 MED ORDER — HYDROCODONE-ACETAMINOPHEN 5-325 MG PO TABS
ORAL_TABLET | ORAL | Status: AC
  Administered 2012-04-01: 1.5 via ORAL
  Filled 2012-04-01: qty 2

## 2012-04-01 MED ORDER — PHENAZOPYRIDINE HCL 200 MG PO TABS: 200.0000 mg | ORAL_TABLET | Freq: Three times a day (TID) | ORAL | Status: AC | PRN

## 2012-04-01 MED ORDER — PROPOFOL 10 MG/ML IV BOLUS
INTRAVENOUS | Status: DC | PRN
  Administered 2012-04-01: 200 mg via INTRAVENOUS

## 2012-04-01 SURGICAL SUPPLY — 19 items
ADAPTER CATH URET PLST 4-6FR (CATHETERS) ×3 IMPLANT
BAG URO CATCHER STRL LF (DRAPE) ×3 IMPLANT
CATH INTERMIT  6FR 70CM (CATHETERS) ×3 IMPLANT
CATH URET 5FR 28IN OPEN ENDED (CATHETERS) IMPLANT
CLOTH BEACON ORANGE TIMEOUT ST (SAFETY) ×3 IMPLANT
DRAPE CAMERA CLOSED 9X96 (DRAPES) ×3 IMPLANT
GLOVE BIOGEL M STRL SZ7.5 (GLOVE) ×3 IMPLANT
GLOVE SURG SS PI 8.0 STRL IVOR (GLOVE) ×3 IMPLANT
GOWN PREVENTION PLUS XLARGE (GOWN DISPOSABLE) ×3 IMPLANT
GOWN STRL NON-REIN LRG LVL3 (GOWN DISPOSABLE) ×3 IMPLANT
GOWN STRL REIN XL XLG (GOWN DISPOSABLE) ×3 IMPLANT
GUIDEWIRE STR DUAL SENSOR (WIRE) ×6 IMPLANT
LASER FIBER DISP (UROLOGICAL SUPPLIES) ×3 IMPLANT
MANIFOLD NEPTUNE II (INSTRUMENTS) ×3 IMPLANT
MARKER SKIN DUAL TIP RULER LAB (MISCELLANEOUS) ×3 IMPLANT
NS IRRIG 1000ML POUR BTL (IV SOLUTION) ×3 IMPLANT
PACK CYSTO (CUSTOM PROCEDURE TRAY) ×3 IMPLANT
SCRUB PCMX 4 OZ (MISCELLANEOUS) ×3 IMPLANT
TUBING CONNECTING 10 (TUBING) ×3 IMPLANT

## 2012-04-01 NOTE — H&P (Signed)
History of Present Illness     51 y/o white male with h/o nephrolithiasis and  RLQ abdominal pain and diffisulty voiding.  He was scheduled for Rt ESWL on 11/30/11 for right proximal ureteral stone, but stone could not be visualized.  The stone was not visualized on f/u KUB in the office 12/07/12 and he had not had further pains so it was felt that he had likely passed the stone.  He had been getting along well until Monday 03/25/12 when he developed N/V, diarrhea and diffuse abdominal discomfort.  His wife had just gotten over a GI bug so he assumed that he had the same.  That evening, he felt lousy and had some low grade fever and chills.  He did not have any further N/V/D and the fever/chills had resolved by morning.  He has not had further fever, chills, N/V/D but has continued with anorexia and RLQ abdominal discomfort.  He has also noticed a weaker FOS, hesitancy despite urgency and some burning in his penis occasionally.  No dysuria or gross hematuria.    He has had prior ESWL x 3.  He has had a couple of stents as well but back in the 1990's.  He had a right ureterolithotomy at age 39.  He has had basket extractions as well.     Past Medical History Problems  1. History of  Adult Sleep Apnea 780.57 2. History of  Allergic Rhinitis 477.9 3. History of  Dyslipidemia 272.4 4. History of  Hypertension 401.9 5. History of  Nephrolithiasis V13.01 6. History of  Nephrolithiasis V13.01 7. History of  Obstructive Sleep Apnea 327.23  Surgical History Problems  1. History of  Complete Colonoscopy 2. History of  Cystoscopy With Insertion Of Ureteral Stent 3. History of  Cystoscopy With Manipulation Of Ureteral Calculus 4. History of  Cystoscopy With Ureteroscopy 5. History of  Inguinal Hernia Repair Bilateral 6. History of  Kidney Surgery 7. History of  Lithotripsy 8. History of  Primary Repair Of Knee Ligament Cruciate Anterior 9. History of  Primary Repair Of Knee Ligament Cruciate  Anterior 10. History of  Varicose Vein Ligation Left  Current Meds 1. Diltiazem HCl 60 MG Oral Tablet; Therapy: 26Nov2012 to 2. Simvastatin TABS; Therapy: (Recorded:26Nov2012) to 3. Uribel 118 MG Oral Capsule; Therapy: (Recorded:21Dec2012) to 4. VESIcare TABS; Therapy: (Recorded:21Dec2012) to  Allergies Medication  1. No Known Drug Allergies  Family History Problems  1. Maternal history of  Breast Cancer V16.3 2. Family history of  Death In The Family Father Died age 45 Emphysema 3. Family history of  Death In The Family Mother Died cancer of thyroid age 19 1. Paternal history of  Emphysema 5. Family history of  Family Health Status Number Of Children 2 sosn 6. Maternal history of  Lung Cancer V16.1 7. Maternal history of  Metastasis To Liver 8. Maternal history of  Thyroid Cancer  Social History Problems  1. Being A Social Drinker less than 1 per day 2. Caffeine Use 2 a week 3. Marital History - Currently Married 4. 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 History of  Tobacco Use 305.1  Review of Systems Genitourinary, constitutional, skin, eye, otolaryngeal, hematologic/lymphatic, cardiovascular, pulmonary, endocrine, musculoskeletal, gastrointestinal, neurological and psychiatric system(s) were reviewed and pertinent findings if present are noted.  Genitourinary: urinary frequency, feelings of urinary urgency, difficulty starting the urinary stream, weak urinary stream and penile pain.  Gastrointestinal: nausea and abdominal pain.    Vitals Vital Signs [Data Includes:  Last 1 Day]  12Apr2013 09:20AM  Blood Pressure: 119 / 86 Temperature: 98.4 F Heart Rate: 71  Physical Exam Constitutional: Well nourished and well developed . No acute distress.  ENT:. The ears and nose are normal in appearance.  Neck: The appearance of the neck is normal and no neck mass is present.  Pulmonary: No respiratory distress and normal respiratory rhythm and effort.   Cardiovascular:. No peripheral edema.  Abdomen: The abdomen is not distended. The abdomen is soft and nontender. No CVA tenderness.  Skin: Normal skin turgor, no visible rash and no visible skin lesions.  Neuro/Psych:. Mood and affect are appropriate.    Results/Data Urine [Data Includes: Last 1 Day]   12Apr2013  COLOR GREEN   APPEARANCE CLEAR   SPECIFIC GRAVITY 1.025   pH 5.5   GLUCOSE NEG mg/dL  BILIRUBIN SMALL   KETONE NEG mg/dL  BLOOD LARGE   PROTEIN TRACE mg/dL  UROBILINOGEN 0.2 mg/dL  NITRITE NEG   LEUKOCYTE ESTERASE NEG   SQUAMOUS EPITHELIAL/HPF RARE   WBC 4-6 WBC/hpf  RBC 0-3 RBC/hpf  BACTERIA FEW   CRYSTALS NONE SEEN   CASTS Hyaline casts noted   Other Mucus noted.    The following images/tracing/specimen were independently visualized: .     CTU demonstrates the previously noted  6x49mm right proximal ureteral stone now present in the right distal ureter at the UVJ with mild hydroureteronephrosis.  No other urolithiasis noted.  The following clinical lab reports were reviewed: Marland Kitchen  Urinalysis Selected Results  UA With REFLEX 12Apr2013 09:07AM Bruning, Ashlyn   Test Name Result Flag Reference  COLOR GREEN A YELLOW  Biochemicals may be affected by the color of the urine.  APPEARANCE CLEAR  CLEAR  SPECIFIC GRAVITY 1.025  1.005-1.030  pH 5.5  5.0-8.0  GLUCOSE NEG mg/dL  NEG  BILIRUBIN SMALL A NEG  KETONE NEG mg/dL  NEG  BLOOD LARGE A NEG  PROTEIN TRACE mg/dL  NEG  UROBILINOGEN 0.2 mg/dL  1.6-1.0  NITRITE NEG  NEG  LEUKOCYTE ESTERASE NEG  NEG  SQUAMOUS EPITHELIAL/HPF RARE  RARE  WBC 4-6 WBC/hpf A <4  RBC 0-3 RBC/hpf  <4  BACTERIA FEW A RARE  CRYSTALS NONE SEEN  NONE SEEN  CASTS Hyaline casts noted  NONE SEEN  Other Mucus noted.     Assessment Assessed  1. Ureteral Stone Right 592.1 2. Dysuria 788.1 3. Diffuse Abdominal Pain 789.07  Plan Diffuse Abdominal Pain (789.07)  1. AU CT-STONE PROTOCOL  Done: 12Apr2013 12:00AM Feelings Of Urinary Urgency  (788.63), Ureteral Stone (592.1)  2. Follow-up Month x 4 Office  Follow-up  Requested for: Apr 2013 Health Maintenance (V70.0)  3. UA With REFLEX  Done: 12Apr2013 09:07AM Ureteral Stone (592.1)  4. Oxycodone-Acetaminophen 5-325 MG Oral Tablet; 1-2 every 6 hrs. as needed for pain; Therapy:  12Apr2013 to (Last Rx:12Apr2013) 5. Tamsulosin HCl 0.4 MG Oral Capsule; TAKE 1 CAPSULE Daily to facilitate stone passage;  Therapy: 12Apr2013 to (Evaluate:11Jul2013)  Requested for: 12Apr2013; Last Rx:12Apr2013;  Edited  PLAN: for stone extraction today.   Signatures

## 2012-04-01 NOTE — Discharge Instructions (Signed)
Kidney Stones Kidney stones (ureteral lithiasis) are solid masses that form inside your kidneys. The intense pain is caused by the stone moving through the kidney, ureter, bladder, and urethra (urinary tract). When the stone moves, the ureter starts to spasm around the stone. The stone is usually passed in the urine.  HOME CARE  Drink enough fluids to keep your pee (urine) clear or pale yellow. This helps to get the stone out.   Strain all pee through the provided strainer. Do not pee without peeing through the strainer, not even once. If you pee the stone out, catch it. The stone may be as small as a grain of salt. Take this to your doctor.   Only take medicine as told by your doctor.   Follow up with your doctor as told.   Get follow-up X-rays as told by your doctor.  GET HELP RIGHT AWAY IF:   Your pain does not get better with medicine.   You have a fever.   Your pain increases and gets worse over 18 hours.   You have new belly (abdominal) pain.   You feel faint or pass out.  MAKE SURE YOU:   Understand these instructions.   Will watch your condition.   Will get help right away if you are not doing well or get worse.  Document Released: 05/22/2008 Document Revised: 11/23/2011 Document Reviewed: 10/01/2009 [facility] Patient Information 2012 [place], [place].Ureteral Colic (Kidney Stones) Ureteral colic is the result of a condition when kidney stones form inside the kidney. Once kidney stones are formed they may move into the tube that connects the kidney with the bladder (ureter). If this occurs, this condition may cause pain (colic) in the ureter.  CAUSES  Pain is caused by stone movement in the ureter and the obstruction caused by the stone. SYMPTOMS  The pain comes and goes as the ureter contracts around the stone. The pain is usually intense, sharp, and stabbing in character. The location of the pain may move as the stone moves through the ureter. When the stone is near the  kidney the pain is usually located in the back and radiates to the belly (abdomen). When the stone is ready to pass into the bladder the pain is often located in the lower abdomen on the side the stone is located. At this location, the symptoms may mimic those of a urinary tract infection with urinary frequency. Once the stone is located here it often passes into the bladder and the pain disappears completely. TREATMENT   Your caregiver will provide you with medicine for pain relief.   You may require specialized follow-up X-rays.   The absence of pain does not always mean that the stone has passed. It may have just stopped moving. If the urine remains completely obstructed, it can cause loss of kidney function or even complete destruction of the involved kidney. It is your responsibility and in your interest that X-rays and follow-ups as suggested by your caregiver are completed. Relief of pain without passage of the stone can be associated with severe damage to the kidney, including loss of kidney function on that side.   If your stone does not pass on its own, additional measures may be taken by your caregiver to ensure its removal.  HOME CARE INSTRUCTIONS   Increase your fluid intake. Water is the preferred fluid since juices containing vitamin C may acidify the urine making it less likely for certain stones (uric acid stones) to pass.   Strain  all urine. A strainer will be provided. Keep all particulate matter or stones for your caregiver to inspect.   Take your pain medicine as directed.   Make a follow-up appointment with your caregiver as directed.   Remember that the goal is passage of your stone. The absence of pain does not mean the stone is gone. Follow your caregiver's instructions.   Only take over-the-counter or prescription medicines for pain, discomfort, or fever as directed by your caregiver.  SEEK MEDICAL CARE IF:   Pain cannot be controlled with the prescribed medicine.     You have a fever.   Pain continues for longer than your caregiver advises it should.   There is a change in the pain, and you develop chest discomfort or constant abdominal pain.   You feel faint or pass out.  MAKE SURE YOU:   Understand these instructions.   Will watch your condition.   Will get help right away if you are not doing well or get worse.  Document Released: 09/13/2005 Document Revised: 11/23/2011 Document Reviewed: 05/31/2011 [facility] Patient Information 2012 [place], [place].Ureteral Colic Ureteral colic is spasm-like pain from the kidney or the ureter. This is often caused by a kidney stone. The pain is caused by the stone trying to get through the tubes that pass your pee. HOME CARE   Drink enough fluids to keep your pee (urine) clear or pale yellow.   Strain all your pee. A strainer will be provided. Keep anything caught in the strainer and bring it to your doctor. The stone causing the pain may be very small.   Only take medicine as told by your doctor.   Follow up with your doctor as told.  GET HELP RIGHT AWAY IF:   Pain is not controlled with medicine.   Pain continues or gets worse.   The pain changes and there is chest or belly (abdominal) pain.   You pass out (faint).   You cannot pee.   You keep throwing up (vomiting).   You have a temperature by mouth above 102 F (38.9 C), not controlled by medicine.  MAKE SURE YOU:   Understand these instructions.   Will watch this condition.   Will get help right away if you are not doing well or get worse.  Document Released: 05/22/2008 Document Revised: 11/23/2011 Document Reviewed: 05/22/2008 [facility] Patient Information 2012 [place], [place].

## 2012-04-01 NOTE — Transfer of Care (Signed)
Immediate Anesthesia Transfer of Care Note  Patient: Devon Griffith  Procedure(s) Performed: Procedure(s) (LRB): CYSTOSCOPY WITH RETROGRADE PYELOGRAM, URETEROSCOPY AND STENT PLACEMENT (Right) HOLMIUM LASER APPLICATION (Right)  Patient Location: PACU  Anesthesia Type: General  Level of Consciousness: sedated  Airway & Oxygen Therapy: Patient Spontanous Breathing and Patient connected to face mask oxygen  Post-op Assessment: Report given to PACU RN and Post -op Vital signs reviewed and stable  Post vital signs: Reviewed and stable  Complications: No apparent anesthesia complications

## 2012-04-01 NOTE — Anesthesia Postprocedure Evaluation (Signed)
  Anesthesia Post-op Note  Patient: Devon Griffith  Procedure(s) Performed: Procedure(s) (LRB): CYSTOSCOPY WITH RETROGRADE PYELOGRAM, URETEROSCOPY AND STENT PLACEMENT (Right) HOLMIUM LASER APPLICATION (Right)  Patient Location: PACU  Anesthesia Type: General  Level of Consciousness: oriented and sedated  Airway and Oxygen Therapy: Patient Spontanous Breathing  Post-op Pain: mild  Post-op Assessment: Post-op Vital signs reviewed, Patient's Cardiovascular Status Stable, Respiratory Function Stable and Patent Airway  Post-op Vital Signs: stable  Complications: No apparent anesthesia complications

## 2012-04-01 NOTE — Interval H&P Note (Signed)
History and Physical Interval Note:  04/01/2012 7:56 AM  Lorri Frederick  has presented today for surgery, with the diagnosis of right distal ureteral stone  The various methods of treatment have been discussed with the patient and family. After consideration of risks, benefits and other options for treatment, the patient has consented to  Procedure(s) (LRB): CYSTOSCOPY WITH RETROGRADE PYELOGRAM, URETEROSCOPY AND STENT PLACEMENT (Right) HOLMIUM LASER APPLICATION (Right) as a surgical intervention .  The patients' history has been reviewed, patient examined, no change in status, stable for surgery.  I have reviewed the patients' chart and labs.  Questions were answered to the patient's satisfaction.     Jethro Bolus I

## 2012-04-01 NOTE — Op Note (Signed)
Pre-Op Diagnosis:   Retained R lower Ureteral stone  Post-op Dx: Same  OPERATION: Cystoscopy,Right retrograde pyelogram wit interpretation, short and long ureteroscopy, laser fragmentation of mid-ureteral stone, basket extraction mid Right  ureteral stone fragments,Right JJ stent ( (82F x 26cm).  Surgeon: Voyd Groft  Anesthesia: Gen LMA  Preparation: After appropriate pre-anesthesia, the pt was brought into the operating room, and placed upon the operating table, where she underwent general LMA anesthesia. Time-outs observed, and the pt's operative side was appropriately marked.   Patient' History was reviewed:  51 yo male with several month hx of episodic ureteral colic, with 6mm R mid ureteral stone found on CT, but not identified at lithotripsy, now with non-progression of stone, and recurrent ureteral colic. Now for laser fragmentation,and removal.   Chance of success: Excellent.     Cystoscopy was accomplished, showing normal urethra, bladder base, and ureteral orifices. No bladder stone, tumor, or diverticula are noted.     Right retrograde pyelogram was accomplished, without identification of the stone in the ureter. No hydronephrosis seen. A .038 guidewire was passed  into the renal pelvis-identified with contrast. Ureterscopy was then accomplished, without identification with the short ureteroscope. Therefore, the long ureteroscope was passed, and the stone identified in the mid ureter and photographed.  The laser was used to fragment the stone, and the pieces were basket extracted.  Some bleeding was noted within the ureter, and it was elected to place  JJ stent. This was accomplished over the guide wire, and coiled in the kidney and bladder under fluoroscopic control.  The patient was given IV toradol and previously received IV tylenol, and B& O suppository; and antibiotic.     He was awakened and taken to the recovery room in good condition.

## 2012-04-01 NOTE — Anesthesia Preprocedure Evaluation (Signed)
Anesthesia Evaluation  Patient identified by MRN, date of birth, ID band Patient awake    Reviewed: Allergy & Precautions, H&P , NPO status , Patient's Chart, lab work & pertinent test results, reviewed documented beta blocker date and time   Airway Mallampati: II TM Distance: >3 FB Neck ROM: Full    Dental  (+) Teeth Intact and Dental Advisory Given   Pulmonary sleep apnea and Continuous Positive Airway Pressure Ventilation ,  breath sounds clear to auscultation        Cardiovascular hypertension, Pt. on medications Rhythm:Regular Rate:Normal  Denies cardiac symptoms   Neuro/Psych negative neurological ROS  negative psych ROS   GI/Hepatic negative GI ROS, Neg liver ROS,   Endo/Other  Morbid obesity  Renal/GU Kidney stone  negative genitourinary   Musculoskeletal negative musculoskeletal ROS (+)   Abdominal   Peds negative pediatric ROS (+)  Hematology negative hematology ROS (+)   Anesthesia Other Findings   Reproductive/Obstetrics negative OB ROS                           Anesthesia Physical Anesthesia Plan  ASA: III  Anesthesia Plan: General   Post-op Pain Management:    Induction: Intravenous  Airway Management Planned: LMA  Additional Equipment:   Intra-op Plan:   Post-operative Plan: Extubation in OR  Informed Consent: I have reviewed the patients History and Physical, chart, labs and discussed the procedure including the risks, benefits and alternatives for the proposed anesthesia with the patient or authorized representative who has indicated his/her understanding and acceptance.   Dental advisory given  Plan Discussed with: CRNA and Surgeon  Anesthesia Plan Comments:         Anesthesia Quick Evaluation

## 2012-07-10 ENCOUNTER — Other Ambulatory Visit: Payer: Self-pay | Admitting: Gastroenterology

## 2013-05-09 ENCOUNTER — Ambulatory Visit (INDEPENDENT_AMBULATORY_CARE_PROVIDER_SITE_OTHER): Payer: BC Managed Care – PPO | Admitting: Cardiovascular Disease

## 2013-05-09 ENCOUNTER — Encounter: Payer: Self-pay | Admitting: Cardiovascular Disease

## 2013-05-09 VITALS — BP 118/82 | HR 66 | Ht 71.0 in | Wt 277.5 lb

## 2013-05-09 DIAGNOSIS — R5381 Other malaise: Secondary | ICD-10-CM

## 2013-05-09 DIAGNOSIS — E8881 Metabolic syndrome: Secondary | ICD-10-CM

## 2013-05-09 DIAGNOSIS — I10 Essential (primary) hypertension: Secondary | ICD-10-CM

## 2013-05-09 DIAGNOSIS — E669 Obesity, unspecified: Secondary | ICD-10-CM

## 2013-05-09 DIAGNOSIS — E785 Hyperlipidemia, unspecified: Secondary | ICD-10-CM | POA: Insufficient documentation

## 2013-05-09 DIAGNOSIS — R079 Chest pain, unspecified: Secondary | ICD-10-CM | POA: Insufficient documentation

## 2013-05-09 DIAGNOSIS — R5383 Other fatigue: Secondary | ICD-10-CM

## 2013-05-09 DIAGNOSIS — I499 Cardiac arrhythmia, unspecified: Secondary | ICD-10-CM

## 2013-05-09 LAB — COMPREHENSIVE METABOLIC PANEL
ALT: 37 U/L (ref 0–53)
AST: 16 U/L (ref 0–37)
Alkaline Phosphatase: 42 U/L (ref 39–117)
Creat: 0.92 mg/dL (ref 0.50–1.35)
Total Bilirubin: 0.6 mg/dL (ref 0.3–1.2)

## 2013-05-09 LAB — CBC
MCH: 31 pg (ref 26.0–34.0)
MCHC: 35.1 g/dL (ref 30.0–36.0)
MCV: 88.3 fL (ref 78.0–100.0)
Platelets: 223 10*3/uL (ref 150–400)
RDW: 13.8 % (ref 11.5–15.5)
WBC: 5.8 10*3/uL (ref 4.0–10.5)

## 2013-05-09 LAB — HEMOGLOBIN A1C
Hgb A1c MFr Bld: 4.9 % (ref <5.7)
Mean Plasma Glucose: 94 mg/dL (ref <117)

## 2013-05-09 NOTE — Patient Instructions (Signed)
Your physician has requested that you have an exercise myoview. For further information please visit [email or web address] Please follow instruction sheet, as given.  Your physician has requested that you have an echocardiogram. Echocardiography is a painless test that uses sound waves to create images of your heart. It provides your doctor with information about the size and shape of your heart and how well your heart's chambers and valves are working. This procedure takes approximately one hour. There are no restrictions for this procedure.   Your physician recommends that you return for lab work today.  Your physician recommends that you schedule a follow-up appointment in: 4 to 6 weeks.

## 2013-05-09 NOTE — Progress Notes (Signed)
Patient ID: Devon Griffith, male   DOB: 1961/06/18, 52 y.o.   MRN: [id number]   PATIENT PROFILE: [name] is a 52 year old white male who is the husband of my patient and is followed by Dr. Catha Gosselin for primary care.   HPI: [name] admits to a history of hyperlipidemia for at least 5 years for which she's been on simvastatin therapy. He has a history of hypertension and has been on medication for approximately one year. Patient admits to a  family history as well as a history of obesity. He recently has noticed episodes of chest pressure and tightness which gives him an and an easy as sensation. Over the past 8 months, he did tear the MCL of his right knee and consequently has not been as active. He has gained approximately 20 pounds back from weight which he had previously lost. He has noticed some mild shortness of breath. He is unaware of any nocturnal symptoms. He does have a history of sleep apnea, multiple kidney stone episodes, and has noticed at times some early morning blurring of vision. He tells me he recently underwent a life screening evaluation and was told not having any blockages in his carotids or lower extremities. Because of his episodes of chest discomfort he now presents for establishment of Cardiologic care and further evaluation.   Past Medical History  Diagnosis Date  . Hyperlipidemia   . Knee pain   . Pain in limb   . Urinary frequency   . Kidney stones   . Hypertension   . Sleep apnea     CPAP  settings at 10   . H/O hiatal hernia     hx of  . Arrhythmia     wore monitor 2-3 weeks.aprox 5 years ago.    Past Surgical History  Procedure Laterality Date  . Hernia repair  2000  . Knee arthroscopy w/ acl reconstruction  2002    left knee  . Lithotripsy  2002  . [facility]ic[facility] vein [facility]  [facility]    No Known Allergies  Current Outpatient Prescriptions  Medication Sig Dispense Refill  . diltiazem (CARDIZEM CD) 120 MG 24 hr capsule  Take 120 mg by mouth every morning.       . simvastatin (ZOCOR) 20 MG tablet Take 20 mg by mouth at bedtime.        No current facility-administered medications for this visit.    Social, he is married 28 years. 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 He typically works 70 hours per week. He has 2 children both in place for all in college, with his oldest son graduating from [place] this past year and his younger son still playing at the [employer]. There is no significant tobacco history although approximately 5 years ago he states he did for short period of time smokes several cigars. He does drink beer and wine.  Family History  Problem Relation Age of Onset  . Emphysema Father   . Cancer Mother     ROV is negative finger chills or night sweats. He does admit to a long-standing history of obesity. He denies rash. Time she does note some mild blurring in his vision in the morning. He denies is. He denies tachycardia palpitations. He does note some very mild shortness of breath. He admits he does have his episodes of chest discomfort that may last for several hours. He  becomes very paranoid about heart disease has this when he has these episodes. He denies change in bowel or bladder habits. He does have history of multiple kidney stones and has undergone vascular retrieval, multiple lithotripsies. He denies change in bowel or bladder habits. He denies recent swelling. He denies claudication. He denies neurologic symptoms. Other system review is negative.  PE BP 118/82  Pulse 66  Ht 5\' 11"  (1.803 m)  Wt 277 lb 8 oz (125.873 kg)  BMI 38.72 kg/m2 General: Alert, oriented, no distress.  HEENT: Normocephalic, atraumatic. Pupils round and reactive; sclera anicteric; Fundi without hemorrhages or exudates. Nose without nasal septal hypertrophy Mouth/Parynx benign; Mallinpatti scale 3 Neck: No JVD, no carotid briuts Lungs: clear to ausculatation and  percussion; no wheezing or rales Heart: RRR, s1 s2 normal 1/6 sem Abdomen: Mildly obese; soft, nontender; no hepatosplenomehaly, BS+; abdominal aorta nontender and not dilated by palpation. Pulses 2+ Extremities: no clubbinbg cyanosis or edema, Homan's sign negative  Neurologic: grossly nonfocal    ECG: normal sinus rhythm at 66. PR interval 148 ms, QTC 398 ms.  ASSESSMENT AND PLAN:  My impression is that Devon Griffith is a 52 year old gentleman who has a history of moderate obesity, a history of hyperlipidemia, hypertension, and obstructive sleep apnea. He has noticed episodes of chest discomfort which he describes more as a fullness or pressure which at times can last up to several hours. This gives him a sensation of "uneasiness."  He admits to recent 20 pound weight gain due to reduced activity. Presently, I am recommending laboratory the obtained in the fasting state consisting of a CBC, CMet, TSH, hemoglobin A1c, vitamin D level, and since he does have a history of hyperlipidemia already on simvastatin I am checking an NMR lipo profile with lipid panel for more optimal assessment of his lipids.  I'm scheduling him for an echo Doppler study to evaluate systolic and diastolic function. Scheduling him for an exercise Myoview study to further evaluate his episodes of chest pressure. SI will see him back in the office in followup of these studies over the next month.  Further recommendations will be made at that time.  Lennette Bihari M.D. [facility] 05/09/2013 12:19 PM

## 2013-05-14 LAB — NMR LIPOPROFILE WITH LIPIDS
HDL Particle Number: 30.5 umol/L (ref >=30.5)
HDL-C: 40 mg/dL (ref >=40)
LP-IR Score: 77 — ABNORMAL HIGH (ref <=45)
Large HDL-P: <1.3 umol/L — ABNORMAL LOW (ref >=4.8)
Triglycerides: 169 mg/dL — ABNORMAL HIGH (ref <150)

## 2013-05-15 LAB — VITAMIN D 1,25 DIHYDROXY
Vitamin D 1, 25 (OH)2 Total: 66 pg/mL (ref 18–72)
Vitamin D3 1, 25 (OH)2: 66 pg/mL

## 2013-06-10 ENCOUNTER — Ambulatory Visit (HOSPITAL_BASED_OUTPATIENT_CLINIC_OR_DEPARTMENT_OTHER)
Admission: RE | Admit: 2013-06-10 | Discharge: 2013-06-10 | Disposition: A | Discharge status: Home / Self Care | Payer: BC Managed Care – PPO | Source: Ambulatory Visit | Attending: Cardiovascular Disease | Admitting: Cardiovascular Disease

## 2013-06-10 ENCOUNTER — Ambulatory Visit (HOSPITAL_COMMUNITY)
Admission: RE | Admit: 2013-06-10 | Discharge: 2013-06-10 | Disposition: A | Discharge status: Home / Self Care | Payer: BC Managed Care – PPO | Source: Ambulatory Visit | Attending: Cardiovascular Disease | Admitting: Cardiovascular Disease

## 2013-06-10 DIAGNOSIS — I10 Essential (primary) hypertension: Secondary | ICD-10-CM

## 2013-06-10 DIAGNOSIS — R079 Chest pain, unspecified: Secondary | ICD-10-CM

## 2013-06-10 DIAGNOSIS — F17200 Nicotine dependence, unspecified, uncomplicated: Secondary | ICD-10-CM | POA: Insufficient documentation

## 2013-06-10 DIAGNOSIS — E669 Obesity, unspecified: Secondary | ICD-10-CM | POA: Insufficient documentation

## 2013-06-10 DIAGNOSIS — R002 Palpitations: Secondary | ICD-10-CM | POA: Insufficient documentation

## 2013-06-10 DIAGNOSIS — Z8249 Family history of ischemic heart disease and other diseases of the circulatory system: Secondary | ICD-10-CM | POA: Insufficient documentation

## 2013-06-10 DIAGNOSIS — R0609 Other forms of dyspnea: Secondary | ICD-10-CM | POA: Insufficient documentation

## 2013-06-10 DIAGNOSIS — R0989 Other specified symptoms and signs involving the circulatory and respiratory systems: Secondary | ICD-10-CM | POA: Insufficient documentation

## 2013-06-10 MED ORDER — TECHNETIUM TC 99M SESTAMIBI GENERIC - CARDIOLITE
30.5000 | Freq: Once | INTRAVENOUS | Status: AC | PRN
  Administered 2013-06-10: 31 via INTRAVENOUS

## 2013-06-10 MED ORDER — TECHNETIUM TC 99M SESTAMIBI GENERIC - CARDIOLITE
10.5000 | Freq: Once | INTRAVENOUS | Status: AC | PRN
  Administered 2013-06-10: 11 via INTRAVENOUS

## 2013-06-10 NOTE — Procedures (Addendum)
[facility] [address] [place] [address] [phone]  Cardiology Nuclear Med Study  Devon Griffith is a 52 y.o. male     MRN : [id number]     DOB: 1961/07/22  Procedure Date: 06/10/2013  Nuclear Med Background Indication for Stress Test:  Evaluation for Ischemia History:  NO PRIOR HISTORY REPORTED Cardiac Risk Factors: Family History - CAD, History of Smoking, Hypertension, Lipids and Obesity  Symptoms:  Chest Pain, DOE, Palpitations and SOB   Nuclear Pre-Procedure Caffeine/Decaff Intake:  7:00pm NPO After: 5:00am   IV Site: R Hand  IV 0.9% NS with Angio Cath:  22g  Chest Size (in):  44"  IV Started by: Emmit Pomfret, RN  Height: 5\' 11"  (1.803 m)  Cup Size: n/a  BMI:  Body mass index is 38.65 kg/(m^2). Weight:  277 lb (125.646 kg)   Tech Comments:  N/A     Nuclear Med Study 1 or 2 day study: 1 day  Stress Test Type:  Stress  Order Authorizing Provider:  Nicki Guadalajara, MD   Resting Radionuclide: Technetium 62m Sestamibi  Resting Radionuclide Dose: 10.5 mCi   Stress Radionuclide:  Technetium 34m Sestamibi  Stress Radionuclide Dose: 30.5 mCi           Stress Protocol Rest HR: 70 Stress HR: 150  Rest BP: 138/105 Stress BP: 168/92  Exercise Time (min): 9:30 METS: 10.1   Predicted Max HR: 167 bpm % Max HR: 89.82 bpm Rate Pressure Product: 78469  Dose of Adenosine (mg):  n/a Dose of Lexiscan: n/a mg  Dose of Atropine (mg): n/a Dose of Dobutamine: n/a mcg/kg/min (at max HR)  Stress Test Technologist: Esperanza Sheets, CCT Nuclear Technologist: Gonzella Lex, CNMT   Rest Procedure:  Myocardial perfusion imaging was performed at rest 45 minutes following the intravenous administration of Technetium 29m Sestamibi. Stress Procedure:  The patient performed treadmill exercise using a Bruce  Protocol for 9:30 minutes. The patient stopped due to SOB and fatigue and denied any chest pain.  There were no significant ST-T  wave changes.  Technetium 24m Sestamibi was injected at peak exercise and myocardial perfusion imaging was performed after a brief delay.  Transient Ischemic Dilatation (Normal <1.22):  10.5 Lung/Heart Ratio (Normal <0.45):  30.5 QGS EDV:  105 ml QGS ESV:  41 ml LV Ejection Fraction: 61%  Signed by       Rest ECG: NSR - Normal EKG  Stress ECG: No significant change from baseline ECG  QPS Raw Data Images:  Normal; no motion artifact; normal heart/lung ratio. Stress Images:  Normal homogeneous uptake in all areas of the myocardium. Rest Images:  Normal homogeneous uptake in all areas of the myocardium. Subtraction (SDS):  Normal  Impression Exercise Capacity:  Good exercise capacity. BP Response:  Normal blood pressure response. Clinical Symptoms:  No significant symptoms noted. ECG Impression:  No significant ST segment change suggestive of ischemia. Comparison with Prior Nuclear Study: No previous nuclear study performed  Overall Impression:  Normal stress nuclear study.  LV Wall Motion:  NL LV Function; NL Wall Motion   Lennette Bihari, MD  06/10/2013 6:25 PM

## 2013-06-10 NOTE — Progress Notes (Signed)
2D Echo Performed 06/10/2013    Taeler Winning, RCS  

## 2013-06-11 ENCOUNTER — Other Ambulatory Visit: Payer: Self-pay | Admitting: *Deleted

## 2013-06-11 ENCOUNTER — Encounter: Payer: Self-pay | Admitting: *Deleted

## 2013-06-11 MED ORDER — ROSUVASTATIN CALCIUM 20 MG PO TABS: 20.0000 mg | ORAL_TABLET | Freq: Every day | ORAL | Status: DC

## 2013-07-29 ENCOUNTER — Telehealth: Payer: Self-pay | Admitting: Cardiovascular Disease

## 2013-07-29 DIAGNOSIS — E785 Hyperlipidemia, unspecified: Secondary | ICD-10-CM

## 2013-07-29 NOTE — Telephone Encounter (Signed)
Returned call.  Pt stated he had an appt and some labs a couple of months ago.  Stated he received a call from the pharmacy telling him he had a Rx ready for Crestor.  Stated he was never told anything about the results or the medicine.  Stated he also had some other tests and never got the results.  RN apologized for the miscommunication and gave pt lab and stress test results.  Informed RN unable to read Echo results and will notify Dr. Tresa Endo to clarify results and RN will call him back.  Pt also advised to take Crestor if he hasn't started.  Pt stated he has been taking it for about a month now.  Pt verbalized understanding and agreed w/ plan.  Message forwarded to Dr. Tresa Endo to clarify Echo Results so pt can be informed.

## 2013-07-29 NOTE — Telephone Encounter (Signed)
Returning your call. °

## 2013-07-29 NOTE — Telephone Encounter (Signed)
Pt also needs to know if he needs to repeat labs.

## 2013-07-29 NOTE — Telephone Encounter (Signed)
Had a lot of test about 5 weeks ago-never received results.Also was given Crestor,but was already taking a similar medicine-was he suppose to take both of them? No instructions were given.

## 2013-07-29 NOTE — Telephone Encounter (Signed)
Returned call.  Left message to call back before 4pm.  

## 2013-07-31 NOTE — Telephone Encounter (Signed)
Is this normal or abnormal?  Also, does pt need to repeat labs?

## 2013-07-31 NOTE — Telephone Encounter (Signed)
Echo showed nl lv fxn, mild grade i diastolic dysfunction

## 2013-09-02 NOTE — Telephone Encounter (Signed)
Belenda Cruise PharmD [facility] for patient, assured him nothing to worry about with echo.  Will monitor in future. Will send lab order to repeat cholesterol labs in 3 months

## 2014-07-31 ENCOUNTER — Other Ambulatory Visit: Payer: Self-pay | Admitting: Physician Assistant

## 2014-09-15 ENCOUNTER — Other Ambulatory Visit: Payer: Self-pay | Admitting: Cardiovascular Disease

## 2014-09-15 NOTE — Telephone Encounter (Signed)
Rx was sent to pharmacy electronically. 

## 2014-10-12 ENCOUNTER — Other Ambulatory Visit: Payer: Self-pay | Admitting: Cardiovascular Disease

## 2014-10-15 NOTE — Telephone Encounter (Signed)
Pt called in stating that he is completely out of his Crestor and would like it called in to the [facility]. Please call  thanks

## 2014-10-15 NOTE — Telephone Encounter (Signed)
Rx was sent to pharmacy electronically. 

## 2014-11-17 ENCOUNTER — Other Ambulatory Visit: Payer: Self-pay | Admitting: Cardiovascular Disease

## 2014-11-18 NOTE — Telephone Encounter (Signed)
Rx refill sent to patient pharmacy  Patient has appointment on 12/24/14

## 2014-12-24 ENCOUNTER — Ambulatory Visit (INDEPENDENT_AMBULATORY_CARE_PROVIDER_SITE_OTHER): Payer: 59 | Admitting: Cardiovascular Disease

## 2014-12-24 VITALS — BP 118/62 | HR 63 | Ht 71.0 in | Wt 278.5 lb

## 2014-12-24 DIAGNOSIS — G4733 Obstructive sleep apnea (adult) (pediatric): Secondary | ICD-10-CM

## 2014-12-24 DIAGNOSIS — Z79899 Other long term (current) drug therapy: Secondary | ICD-10-CM

## 2014-12-24 DIAGNOSIS — I10 Essential (primary) hypertension: Secondary | ICD-10-CM

## 2014-12-24 DIAGNOSIS — E782 Mixed hyperlipidemia: Secondary | ICD-10-CM

## 2014-12-24 DIAGNOSIS — E785 Hyperlipidemia, unspecified: Secondary | ICD-10-CM

## 2014-12-24 DIAGNOSIS — E669 Obesity, unspecified: Secondary | ICD-10-CM

## 2014-12-24 NOTE — Patient Instructions (Signed)
Your physician wants you to follow-up in: 1 year or sooner if needed with Dr. Claiborne Billings. You will receive a reminder letter in the mail two months in advance. If you don't receive a letter, please call our office to schedule the follow-up appointment.  You have been referred to [facility] to acquire a new mask.   Your physician recommends that you return for lab work fasting.

## 2014-12-26 ENCOUNTER — Encounter: Payer: Self-pay | Admitting: Cardiovascular Disease

## 2014-12-26 DIAGNOSIS — G4733 Obstructive sleep apnea (adult) (pediatric): Secondary | ICD-10-CM | POA: Insufficient documentation

## 2014-12-26 NOTE — Progress Notes (Signed)
Patient ID: Devon Griffith, male   DOB: February 22, 1961, 54 y.o.   MRN: [id number]    HPI:  Devon Griffith is a 54 year old white male who presents for 18 month follow-up evaluation.  Devon Griffith has a history of hypertension, hyperlipidemia , as well as obstructive sleep apnea.  He had been on simvastatin.  Remotely.  Since I last saw him in May 2014.  He is on Crestor 20 mg.  He has been on Cardizem CD 120 mg daily for hypertension.  Apparently, he stopped using CPAP therapy when he had lost significant weight.  He states he lost a proximally 20 pounds from a peak of 290 down to 274.  However, he again has started to notice significant snoring, has taken naps during the daytime.  He walks occasionally.  He has significantly reduced his alcohol intake from 12 drinks a week to proximally to per week.  In 2014.  He underwent an echo Doppler study which revealed normal systolic function with grade 1 diastolic dysfunction.  Ejection fraction was 60-65%.  A nuclear perfusion study done on 06/10/2013 was normal.  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  He denies recent chest pain.  He is unaware of episodes of presyncope or syncope.  He does snore.  He is fatigued.  He states his diet is significantly improved.  He has not had laboratory checked recently.  He presents for a follow-up evaluation   Past Medical History  Diagnosis Date  . Hyperlipidemia   . Knee pain   . Pain in limb   . Urinary frequency   . Kidney stones   . Hypertension   . Sleep apnea     CPAP  settings at 10   . H/O hiatal hernia     hx of  . Arrhythmia     wore monitor 2-3 weeks.aprox 5 years ago.    Past Surgical History  Procedure Laterality Date  . Hernia repair  2000  . Knee arthroscopy w/ acl reconstruction  2002    left knee  . Lithotripsy  2002  . [facility]ic[facility] vein [facility]  [facility]    No Known Allergies  Current Outpatient  Prescriptions  Medication Sig Dispense Refill  . diltiazem (CARDIZEM CD) 120 MG 24 hr capsule Take 120 mg by mouth every morning.     . rosuvastatin (CRESTOR) 20 MG tablet Take 1 tablet (20 mg total) by mouth daily. *MUST KEEP JANUARY APPOINTMENT* 35 tablet 0   No current facility-administered medications for this visit.    Social, he is married 28 years. 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 He typically works 70 hours per week. He has 2 children both in place for all in college, with his oldest son graduating from [place] this past year and his younger son still playing at the [employer]. There is no significant tobacco history although approximately 5 years ago he states he did for short period of time smokes several cigars. He does drink beer and wine.  Family History  Problem Relation Age of Onset  . Emphysema Father   . Cancer Mother     ROS General: Negative; No fevers, chills, or night sweats;  HEENT: Negative; No changes in vision or hearing, sinus congestion, difficulty swallowing Pulmonary: Negative; No cough, wheezing, shortness of breath, hemoptysis Cardiovascular: Negative; No chest pain, presyncope, syncope,  palpitations GI: Negative; No nausea, vomiting, diarrhea, or abdominal pain GU: Negative; No dysuria, hematuria, or difficulty voiding Musculoskeletal: Negative; no myalgias, joint pain, or weakness Hematologic/Oncology: Negative; no easy bruising, bleeding Endocrine: Negative; no heat/cold intolerance; no diabetes Neuro: Negative; no changes in balance, headaches Skin: Negative; No rashes or skin lesions Psychiatric: Negative; No behavioral problems, depression Sleep: Positive for obstructive sleep apnea, currently untreated with  snoring, daytime sleepiness, hypersomnolence;no bruxism, restless legs, hypnogognic hallucinations, no cataplexy Other comprehensive 14 point system review is negative.   PE BP 118/62 mmHg   Pulse 63  Ht 5\' 11"  (1.803 m)  Wt 278 lb 8 oz (126.327 kg)  BMI 38.86 kg/m2 General: Alert, oriented, no distress.  HEENT: Normocephalic, atraumatic. Pupils round and reactive; sclera anicteric; Fundi without hemorrhages or exudates. Nose without nasal septal hypertrophy Mouth/Parynx benign; Mallinpatti scale 3 Neck: Thick neck; No JVD, no carotid bruits Lungs: clear to ausculatation and percussion; no wheezing or rales Chest wall: Nontender to palpation Heart: RRR, s1 s2 normal 1/6 sem; no S3 gallop.  No diastolic murmur, rub thrills or heaves Abdomen: Moderately obese; soft, nontender; no hepatosplenomehaly, BS+; abdominal aorta nontender and not dilated by palpation. Back: No CVA tenderness Pulses 2+ Extremities: no clubbinbg cyanosis or edema, Homan's sign negative  Neurologic: grossly nonfocal Psychological: Normal affect and mood  ECG (independently read by me): Normal sinus rhythm at 63 bpm.  No ectopy.  QTc interval 403 ms.  May 2014 ECG: normal sinus rhythm at 66. PR interval 148 ms, QTC 398 ms.  ASSESSMENT AND PLAN:    Devon Griffith is a 54 year old gentleman who has a history of moderate obesity, hyperlipidemia, hypertension, and obstructive sleep apnea.  Remotely, he had experienced episodes of somewhat atypical chest pain.  In 2014.  A nuclear perfusion study revealed normal perfusion without scar or ischemia.  An echo Doppler study confirmed systolic ejection fraction at 60-65% and he had grade 1 diastolic dysfunction.  His blood pressure today is controlled on his current dose of diltiazem 120 mg.  He has been taking Crestor 20 mg for hyperlipidemia.  He is not had recent laboratory.  He does have obstructive sleep apnea and remotely had benefited from CPAP therapy.  He initially had lost approximately 20 pounds, changed his diet and significantly reduced his alcohol intake since I had last seen him.  However, he does note significant snoring, hypersomnolence, adenitis long  discussion with him concerning the importance of reinstituting CPAP therapy.  He is in need for new mask.  His MDE company is [employer].  I discussed the possibility of a nasal pillow or dream mask rather than full face mask since he did not do well with the full face mask in the past.  He continues to be moderately obese with a body this index of 38.9.  Weight loss is essential.  I discussed exercising 5 days a week for at least 30 minutes of moderate intensity exercise.  A complete set of laboratory will be obtained in the fasting state.  I will contact him regarding the results and adjustments to his medical regimen will be made. I will  refer him to his MDE company for new mask and supplies for CPAP equipment.  I will see him in one year for reevaluation.  Time spent: 30 minutes   Troy Sine M.D. [facility] 12/26/2014 1:40 PM

## 2015-01-21 LAB — CBC
HEMATOCRIT: 47 % (ref 39.0–52.0)
HEMOGLOBIN: 16.4 g/dL (ref 13.0–17.0)
MCH: 31.2 pg (ref 26.0–34.0)
MCHC: 34.9 g/dL (ref 30.0–36.0)
MCV: 89.4 fL (ref 78.0–100.0)
MPV: 11 fL (ref 8.6–12.4)
Platelets: 230 10*3/uL (ref 150–400)
RBC: 5.26 MIL/uL (ref 4.22–5.81)
RDW: 13.4 % (ref 11.5–15.5)
WBC: 8.5 10*3/uL (ref 4.0–10.5)

## 2015-01-21 LAB — COMPREHENSIVE METABOLIC PANEL
ALK PHOS: 40 U/L (ref 39–117)
ALT: 35 U/L (ref 0–53)
AST: 15 U/L (ref 0–37)
Albumin: 4.3 g/dL (ref 3.5–5.2)
BUN: 22 mg/dL (ref 6–23)
CALCIUM: 9.8 mg/dL (ref 8.4–10.5)
CHLORIDE: 101 meq/L (ref 96–112)
CO2: 23 mEq/L (ref 19–32)
CREATININE: 1.07 mg/dL (ref 0.50–1.35)
Glucose, Bld: 78 mg/dL (ref 70–99)
POTASSIUM: 4.4 meq/L (ref 3.5–5.3)
SODIUM: 140 meq/L (ref 135–145)
TOTAL PROTEIN: 7.5 g/dL (ref 6.0–8.3)
Total Bilirubin: 0.6 mg/dL (ref 0.2–1.2)

## 2015-01-21 LAB — LIPID PANEL
CHOLESTEROL: 138 mg/dL (ref 0–200)
HDL: 42 mg/dL (ref >39)
LDL CALC: 65 mg/dL (ref 0–99)
TRIGLYCERIDES: 155 mg/dL — AB (ref <150)
Total CHOL/HDL Ratio: 3.3 Ratio
VLDL: 31 mg/dL (ref 0–40)

## 2015-01-21 LAB — TSH: TSH: 2.112 u[IU]/mL (ref 0.350–4.500)

## 2015-01-26 ENCOUNTER — Encounter: Payer: Self-pay | Admitting: *Deleted

## 2015-01-26 ENCOUNTER — Telehealth: Payer: Self-pay | Admitting: Cardiovascular Disease

## 2015-01-26 MED ORDER — ROSUVASTATIN CALCIUM 20 MG PO TABS: 20.0000 mg | ORAL_TABLET | Freq: Every day | ORAL | Status: DC

## 2015-01-26 NOTE — Telephone Encounter (Signed)
Called pt w/ lab results, he voiced understanding. Medication refilled.

## 2015-01-26 NOTE — Telephone Encounter (Signed)
Pt need to know if his cholesterol medicine needs to be changed. If it does not,he needs a new prescription for Crestor.Please call to [facility] on [address], Please let him know what is decided.

## 2015-04-12 ENCOUNTER — Telehealth: Payer: Self-pay | Admitting: Cardiovascular Disease

## 2015-04-12 ENCOUNTER — Telehealth: Payer: Self-pay | Admitting: Physician Assistant

## 2015-04-12 NOTE — Telephone Encounter (Signed)
Received referral packet on 04/05/2015 packet was given to [facility] for appointment schedule she gave it back to me on 04/12/15 with appointment for 05/07/15. Records given to [facility].  cbr

## 2015-04-12 NOTE — Telephone Encounter (Signed)
Closed encounter °

## 2015-05-04 ENCOUNTER — Ambulatory Visit: Payer: 59 | Admitting: Cardiology

## 2015-05-05 ENCOUNTER — Telehealth: Payer: Self-pay | Admitting: Cardiovascular Disease

## 2015-05-06 NOTE — Telephone Encounter (Signed)
Close encounter 

## 2015-05-07 ENCOUNTER — Ambulatory Visit: Payer: 59 | Admitting: Physician Assistant

## 2015-09-10 ENCOUNTER — Ambulatory Visit
Admission: RE | Admit: 2015-09-10 | Discharge: 2015-09-10 | Disposition: A | Discharge status: Home / Self Care | Payer: 59 | Source: Ambulatory Visit | Attending: Family Medicine | Admitting: Family Medicine

## 2015-09-10 ENCOUNTER — Other Ambulatory Visit: Payer: Self-pay | Admitting: Family Medicine

## 2015-09-10 DIAGNOSIS — M5440 Lumbago with sciatica, unspecified side: Secondary | ICD-10-CM

## 2015-10-07 ENCOUNTER — Ambulatory Visit (INDEPENDENT_AMBULATORY_CARE_PROVIDER_SITE_OTHER): Payer: 59

## 2015-10-07 ENCOUNTER — Encounter: Payer: Self-pay | Admitting: Podiatry

## 2015-10-07 ENCOUNTER — Ambulatory Visit (INDEPENDENT_AMBULATORY_CARE_PROVIDER_SITE_OTHER): Payer: 59 | Admitting: Podiatry

## 2015-10-07 VITALS — BP 148/92 | HR 70 | Resp 16

## 2015-10-07 DIAGNOSIS — M2040 Other hammer toe(s) (acquired), unspecified foot: Secondary | ICD-10-CM | POA: Diagnosis not present

## 2015-10-07 DIAGNOSIS — M2012 Hallux valgus (acquired), left foot: Secondary | ICD-10-CM

## 2015-10-07 DIAGNOSIS — M7752 Other enthesopathy of left foot: Secondary | ICD-10-CM

## 2015-10-07 DIAGNOSIS — M779 Enthesopathy, unspecified: Secondary | ICD-10-CM

## 2015-10-07 DIAGNOSIS — M778 Other enthesopathies, not elsewhere classified: Secondary | ICD-10-CM

## 2015-10-07 NOTE — Progress Notes (Signed)
   Subjective:    Patient ID: Devon Griffith, male    DOB: November 06, 1961, 54 y.o.   MRN: [id number]  HPI: He presents today with bilateral foot pain. He states that he has had deformities to his feet for greater than 10 years. He states that they have been painful for at least that long and seemed to be getting worse as time goes on. He states that his left foot seems to be worse than his right foot. He notices that with certain shoe gear he has moderate to severe pain and has to carefully select his shoe gear in order to lessen his symptoms.    Review of Systems  Musculoskeletal: Positive for myalgias.  All other systems reviewed and are negative.      Objective:   Physical Exam: 54 year old male in no apparent distress presents vital signs stable alert and oriented 3. Pulses are strongly palpable. Neurologic sensorium is intact per Semmes-Weinstein monofilament. Deep tendon reflexes are intact bilateral and muscle strength +5 over 5 dorsiflexion plantar flexors and inverters everters all intrinsic musculature is intact. Orthopedic evaluation demonstrates all joints distal to the ankle in full range of motion without crepitation. Cutaneous evaluation demonstrates supple well-hydrated cutis no erythema edema cellulitis drainage or odor. Orthopedic evaluation does demonstrate moderate hallux abductovalgus deformity bilaterally. First metatarsophalangeal joints appear to be tracking but not yet tried bound. He has no pain on end range of motion of the first metatarsophalangeal joint though he does have pain on palpation of the dorsal lateral aspect of the first metatarsophalangeal joint usually indicating functional hallux limitus. He also has hammertoe deformity which is rigid in nature at the PIPJ left greater than right. He has also moderate tenderness on palpation and in range of motion of the second digit at the level of the metatarsophalangeal joint. Radiographs 3 views bilateral foot today does  demonstrate an increase in the first metatarsophalangeal joint and the angular deformity between the first and second metatarsals which is increased greater than 15. He also does demonstrate early dislocation of the first metatarsophalangeal joint. An elongated second metatarsal is responsible for the hammertoe deformity and the capsulitis he is currently experiencing.        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 Elongated plantarflexed second metatarsal resulting in capsulitis and hammertoe deformity left greater than right. 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  Plan: Discussed etiology and pathology conservative versus surgical therapies. We discussed surgical intervention today in great detail consenting him for an [facility] bunion repair with screw fixation a second metatarsal osteotomy with screw fixation and a hammertoe repair with pins or screws second digit left foot. Due to flexibility of the third digit of the left foot and the distal clavus I simply think a flexor tenotomy will suffice to help alleviate the pressure of the second digit. We discussed the possible postop complications which may include but are not limited to postop pain bleeding swelling infection recurrence need for further surgery overcorrection under correction loss of digit loss of limb loss of life development of pulmonary emboli and DVT. We dispensed a cam walker for his postop recovery today and I will follow-up with him in the near future for surgical intervention.

## 2015-10-07 NOTE — Patient Instructions (Signed)
Pre-Operative Instructions  Congratulations, you have decided to take an important step to improving your quality of life.  You can be assured that the doctors of [facility] will be with you every step of the way.  1. Plan to be at the surgery center/hospital at least 1 (one) hour prior to your scheduled time unless otherwise directed by the surgical center/hospital staff.  You must have a responsible adult accompany you, remain during the surgery and drive you home.  Make sure you have directions to the surgical center/hospital and know how to get there on time. 2. For hospital based surgery you will need to obtain a history and physical form from your family physician within 1 month prior to the date of surgery- we will give you a form for you primary physician.  3. We make every effort to accommodate the date you request for surgery.  There are however, times where surgery dates or times have to be moved.  We will contact you as soon as possible if a change in schedule is required.   4. No Aspirin/Ibuprofen for one week before surgery.  If you are on aspirin, any non-steroidal anti-inflammatory medications (Mobic, Aleve, Ibuprofen) you should stop taking it 7 days prior to your surgery.  You make take Tylenol  For pain prior to surgery.  5. Medications- If you are taking daily heart and blood pressure medications, seizure, reflux, allergy, asthma, anxiety, pain or diabetes medications, make sure the surgery center/hospital is aware before the day of surgery so they may notify you which medications to take or avoid the day of surgery. 6. No food or drink after midnight the night before surgery unless directed otherwise by surgical center/hospital staff. 7. No alcoholic beverages 24 hours prior to surgery.  No smoking 24 hours prior to or 24 hours after surgery. 8. Wear loose pants or shorts- loose enough to fit over bandages, boots, and casts. 9. No slip on shoes, sneakers are best. 10. Bring  your boot with you to the surgery center/hospital.  Also bring crutches or a walker if your physician has prescribed it for you.  If you do not have this equipment, it will be provided for you after surgery. 11. If you have not been contracted by the surgery center/hospital by the day before your surgery, call to confirm the date and time of your surgery. 12. Leave-time from work may vary depending on the type of surgery you have.  Appropriate arrangements should be made prior to surgery with your employer. 13. Prescriptions will be provided immediately following surgery by your doctor.  Have these filled as soon as possible after surgery and take the medication as directed. 14. Remove nail polish on the operative foot. 15. Wash the night before surgery.  The night before surgery wash the foot and leg well with the antibacterial soap provided and water paying special attention to beneath the toenails and in between the toes.  Rinse thoroughly with water and dry well with a towel.  Perform this wash unless told not to do so by your physician.  Enclosed: 1 Ice pack (please put in freezer the night before surgery)   1 Hibiclens skin cleaner   Pre-op Instructions  If you have any questions regarding the instructions, do not hesitate to call our office.  [place]: [address] [place], [place] [address] [phone]  [place]: [address] [place], [place] [address] [phone]  [place]: [address]  , [place] [address] [phone]  Dr. Richard   Tuchman DPM, Dr. Norman Regal DPM Dr. Richard Sikora DPM, Dr. M. Todd Hyatt DPM, Dr. Kathryn Egerton DPM 

## 2015-11-05 ENCOUNTER — Other Ambulatory Visit: Payer: Self-pay | Admitting: Neurosurgery

## 2015-11-05 DIAGNOSIS — M5416 Radiculopathy, lumbar region: Secondary | ICD-10-CM

## 2015-11-09 ENCOUNTER — Telehealth: Payer: Self-pay | Admitting: Cardiovascular Disease

## 2015-11-09 NOTE — Telephone Encounter (Signed)
Pt is calling in stating that his pharmacy is now requesting a Prior authorization for Crestor . Please f/u  Thanks

## 2015-11-18 NOTE — Telephone Encounter (Signed)
Spoke with a pharmacist at [facility]. Patient picked up his generic Crestor and was charged $200. For it This is a paid claim from [employer]. No prior auth was needed.

## 2015-11-18 NOTE — Telephone Encounter (Signed)
Spoke w/ Devon Griffith and explained to him that was his payment and that [employer] did pay the claim . Mr. Amat voice understanding and stated that he will speak with Dr. Claiborne Billings at his next appointment to find something less expensive in which he can take .

## 2015-11-19 ENCOUNTER — Telehealth: Payer: Self-pay | Admitting: Cardiovascular Disease

## 2015-11-19 NOTE — Telephone Encounter (Signed)
Pt is calling back in stating that he spoke with his insurance company about his Crestor medication and he was told that a prior authorization needed to be filed and then if that is denied then a Brand name penalty waiver needed to be filed in order for him to receive his medication at a discounted price. Please f/u with the pt  Thanks

## 2015-11-22 MED ORDER — ROSUVASTATIN CALCIUM 20 MG PO TABS: 20.0000 mg | ORAL_TABLET | Freq: Every day | ORAL | Status: DC

## 2015-11-22 NOTE — Telephone Encounter (Signed)
Pt called, explains that he needed a prior auth for a brand name dispense for crestor.  He wanted to do a prior authorization, ascertained probably would be rejected and have to be appealed via brand name penalty waiver.  However, as we discussed his medication history, seems he had a misunderstanding and assumed that the DAW was required as he had a poor response to the generic for Zocor, not crestor. Pt explains he was on simvastatin and did not have cholesterol reduction. This was the initial reason for the switch. I explained that the rosuvastatin is a different medication and that the cholesterol reduction benefit should be comparable whether he is on Crestor or its generic. Thus, probably best recommendation is to fill Rx for rosuvastatin/crestor w/o DAW directions and have him take generic. Cholesterol reduction benefit should be the same.  Discussed w/ Erasmo Downer who agreed w/ recommendations. Sent Rx to pharmacy & pt aware.

## 2015-11-24 ENCOUNTER — Telehealth: Payer: Self-pay | Admitting: *Deleted

## 2015-11-24 NOTE — Telephone Encounter (Signed)
"  I want to see about scheduling surgery sometime first of the year."

## 2015-11-25 ENCOUNTER — Ambulatory Visit
Admission: RE | Admit: 2015-11-25 | Discharge: 2015-11-25 | Disposition: A | Discharge status: Home / Self Care | Payer: Managed Care, Other (non HMO) | Source: Ambulatory Visit | Attending: Neurosurgery | Admitting: Neurosurgery

## 2015-11-25 DIAGNOSIS — M5416 Radiculopathy, lumbar region: Secondary | ICD-10-CM

## 2015-12-01 NOTE — Telephone Encounter (Signed)
I'm returning your call.  You want to schedule your surgery?  "Yes, I'd like to do it around the last of January."  He can do it on 01/13/2015.  "That date will be fine.  I don't have anything on my calendar the following week which is good right?"  Yes, you will need to be elevating as much as possible.  Go ahead and register with the surgical center.  Instructions on how to do it are in the brochure that you were given.  Surgical center will call you a day or two prior to with the arrival time.  "Okay, thanks for your help."

## 2015-12-09 ENCOUNTER — Encounter: Payer: Managed Care, Other (non HMO) | Attending: Family Medicine | Admitting: Dietician

## 2015-12-09 ENCOUNTER — Encounter: Payer: Self-pay | Admitting: Dietician

## 2015-12-09 VITALS — Ht 70.25 in | Wt 281.2 lb

## 2015-12-09 DIAGNOSIS — Z713 Dietary counseling and surveillance: Secondary | ICD-10-CM | POA: Diagnosis not present

## 2015-12-09 DIAGNOSIS — Z6841 Body Mass Index (BMI) 40.0 and over, adult: Secondary | ICD-10-CM | POA: Insufficient documentation

## 2015-12-09 DIAGNOSIS — E669 Obesity, unspecified: Secondary | ICD-10-CM
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
## 2015-12-09 NOTE — Patient Instructions (Addendum)
Plan to walk some laps while at work 2-3 x week (schedule in work calendar). Save restaurants for either work meetings or true celebrations. Use smaller plates/bowls for meals when possible.  Pay attention to hunger/fullness feelings. Eat when hungry and stop when your no longer hungry.  Try to to take 20 minutes to eat.Try to chew well, put your fork down, and eat without distractions.  At restaurants ask for a to go box with your meal and put half of food in box. Aim to fill half of your plate with vegetables at lunch and dinner.  Have starch/fruit on a quarter of your plate (about what you can hold in in your hand).  Try choosing one starch at a restaurant. Have lean protein on a quarter of your plate (size of the palm of your hand).  Step on scale 1 x week, same day and time of day.  Have carbs with proteins for snacks.

## 2015-12-21 ENCOUNTER — Telehealth: Payer: Self-pay | Admitting: *Deleted

## 2015-12-21 NOTE — Telephone Encounter (Signed)
"  I'm calling about surgery.  I'm supposed to be scheduled for January 27.  I haven't heard anything.  I don't know where to go.  I don't know what time I'm supposed to be there.  I don't know anything."  You are scheduled for the 27th.  Surgery will be done at [facility].  They are located at [place]. [employer].  They will call you a day or two prior to surgery date with the arrival time.  You should have received a brochure with this information on it when you were here last.  You will need to register with the surgical center.  "That's what I'm calling for."  You are not supposed to call us you are to call the surgical center."  What's their phone number?"  Their number is [phone].  "Okay, I will call them."

## 2015-12-24 ENCOUNTER — Ambulatory Visit: Payer: 59 | Admitting: Cardiovascular Disease

## 2016-01-07 ENCOUNTER — Telehealth: Payer: Self-pay | Admitting: *Deleted

## 2016-01-07 NOTE — Telephone Encounter (Signed)
"  This is regarding your request for this patient.  Pre-cert is not required for these codes.  Be sure to check benefits and eligibility of this patient.  Thank you and have a good day."

## 2016-01-12 ENCOUNTER — Other Ambulatory Visit: Payer: Self-pay | Admitting: Podiatry

## 2016-01-12 ENCOUNTER — Telehealth: Payer: Self-pay | Admitting: *Deleted

## 2016-01-12 MED ORDER — OXYCODONE-ACETAMINOPHEN 10-325 MG PO TABS: ORAL_TABLET | ORAL | Status: DC

## 2016-01-12 MED ORDER — CEPHALEXIN 500 MG PO CAPS: 500.0000 mg | ORAL_CAPSULE | Freq: Three times a day (TID) | ORAL | Status: DC

## 2016-01-12 MED ORDER — PROMETHAZINE HCL 25 MG PO TABS: 25.0000 mg | ORAL_TABLET | Freq: Three times a day (TID) | ORAL | Status: DC | PRN

## 2016-01-12 NOTE — Telephone Encounter (Signed)
"  I'm scheduled to have surgery on Friday.  My wife is going to bring me and stay a while and someone else is going to come and relieve her.  My question is, can I get my prescription for pain called in to my pharmacy?"  We cannot call narcotics into the pharmacy.  Prescription has to be taken to pharmacy.  Dr. Milinda Pointer does not give prescriptions in advance.  "Well under this circumstance could you ask him?  I'll come by to pick it up."  I'll ask, see what he says and give you a call back.

## 2016-01-12 NOTE — Telephone Encounter (Addendum)
Pt states he is having surgery on 01/14/2016, and his wife will only be able to wait with him for a short time during surgery, but he will have someone else there as an alternate.  Pt states he would also like the prescriptions called to his pharmacy.  01/13/2016 - LEFT MESSAGE informing pt, Dr. Milinda Pointer would write the post op pain medication rxs to be picked up today in the [facility].

## 2016-01-12 NOTE — Telephone Encounter (Signed)
I spoke with Dr. Milinda Pointer.  He said you can come by office tomorrow to pick it up.  "Okay, that will be great.  I am not an abuser.  I hardly take pain medication at all.  I still have old medications in my cabinet that I haven't used and need to get rid of.  I'll come by late tomorrow to pick it up."

## 2016-01-12 NOTE — Telephone Encounter (Signed)
I think he is a [facility] patient.  I will write it tomorrow when I get there.  Unless he wants to drive to the [place] office to pick it up today.  Just remind me in the morning please.

## 2016-01-13 ENCOUNTER — Ambulatory Visit (INDEPENDENT_AMBULATORY_CARE_PROVIDER_SITE_OTHER): Payer: Managed Care, Other (non HMO) | Admitting: Cardiovascular Disease

## 2016-01-13 ENCOUNTER — Encounter: Payer: Self-pay | Admitting: Cardiovascular Disease

## 2016-01-13 VITALS — BP 128/88 | HR 68 | Ht 70.25 in | Wt 277.0 lb

## 2016-01-13 DIAGNOSIS — E669 Obesity, unspecified: Secondary | ICD-10-CM | POA: Diagnosis not present

## 2016-01-13 DIAGNOSIS — I10 Essential (primary) hypertension: Secondary | ICD-10-CM

## 2016-01-13 DIAGNOSIS — G4733 Obstructive sleep apnea (adult) (pediatric): Secondary | ICD-10-CM | POA: Diagnosis not present

## 2016-01-13 DIAGNOSIS — E785 Hyperlipidemia, unspecified: Secondary | ICD-10-CM

## 2016-01-13 NOTE — Patient Instructions (Signed)
Your physician wants you to follow-up in: 1 year or sooner if needed. You will receive a reminder letter in the mail two months in advance. If you don't receive a letter, please call our office to schedule the follow-up appointment.   If you need a refill on your cardiac medications before your next appointment, please call your pharmacy.   
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
## 2016-01-14 DIAGNOSIS — M21549 Acquired clubfoot, unspecified foot: Secondary | ICD-10-CM | POA: Diagnosis not present

## 2016-01-14 DIAGNOSIS — M2012 Hallux valgus (acquired), left foot: Secondary | ICD-10-CM | POA: Diagnosis not present

## 2016-01-14 DIAGNOSIS — M2042 Other hammer toe(s) (acquired), left foot: Secondary | ICD-10-CM | POA: Diagnosis not present

## 2016-01-19 ENCOUNTER — Telehealth: Payer: Self-pay | Admitting: *Deleted

## 2016-01-19 NOTE — Telephone Encounter (Signed)
Pt states he wanted to know what to expect with his 1POV, he has pins, screws and a rod and he wanted to know if any of those would come out tomorrow.  I told pt we would only x-ray tomorrow, and possibly remove the cast, nothing would be removed at the visit unless it was a dressing or cast.  Pt sounded very apprehensive about being hurt tomorrow, so I told him to take his pain medicine as directed prior to the appt, because he would be with his foot below his heart for more than he probably has in the last week or so.  Pt states understanding.

## 2016-01-20 ENCOUNTER — Encounter: Payer: Self-pay | Admitting: Podiatry

## 2016-01-20 ENCOUNTER — Ambulatory Visit (INDEPENDENT_AMBULATORY_CARE_PROVIDER_SITE_OTHER): Payer: Managed Care, Other (non HMO) | Admitting: Podiatry

## 2016-01-20 ENCOUNTER — Ambulatory Visit (INDEPENDENT_AMBULATORY_CARE_PROVIDER_SITE_OTHER): Payer: Managed Care, Other (non HMO)

## 2016-01-20 VITALS — BP 147/87 | HR 73 | Resp 16

## 2016-01-20 DIAGNOSIS — M2040 Other hammer toe(s) (acquired), unspecified foot: Secondary | ICD-10-CM | POA: Diagnosis not present

## 2016-01-20 DIAGNOSIS — M2012 Hallux valgus (acquired), left foot: Secondary | ICD-10-CM

## 2016-01-20 DIAGNOSIS — Z9889 Other specified postprocedural states: Secondary | ICD-10-CM

## 2016-01-21 ENCOUNTER — Other Ambulatory Visit: Payer: Self-pay

## 2016-01-23 NOTE — Progress Notes (Signed)
He presents today 1 week status post bunion repair left second metatarsal osteotomy with screw fixation left hammertoe repair with pin second digit left and hammertoe tendon release third toe left. He states that he has hardly had the pain that he thought he would. He continues to wear his Cam Gilford Rile and he presents on a knee scooter.  Objective: Vital signs are stable he is alert and oriented 3. Pulses are palpable. Dry sterile dressing intact. Once this was removed demonstrates moderate edema no erythema cellulitis drainage or odor. Margins appear to be well coapted and K wire is intact. Radiographs confirm good placement of the osteotomy first and second with good internal fixation. K wires in good position with good approximation of the arthrodesis site second toe left foot.  Assessment: Well-healing surgical foot left 1 week.  Plan: Redressed today dresser compressive dressing encouraged range of motion exercises encouraged him to stay off this foot and to stay away from work as much as possible this foot dry and clean and elevated follow-up with me in 1 week

## 2016-01-28 ENCOUNTER — Encounter: Payer: Self-pay | Admitting: Podiatry

## 2016-01-28 ENCOUNTER — Ambulatory Visit (INDEPENDENT_AMBULATORY_CARE_PROVIDER_SITE_OTHER): Payer: Managed Care, Other (non HMO) | Admitting: Podiatry

## 2016-01-28 VITALS — BP 118/68 | HR 70 | Resp 18

## 2016-01-28 DIAGNOSIS — M2012 Hallux valgus (acquired), left foot: Secondary | ICD-10-CM

## 2016-01-28 DIAGNOSIS — Z9889 Other specified postprocedural states: Secondary | ICD-10-CM

## 2016-01-28 DIAGNOSIS — M2040 Other hammer toe(s) (acquired), unspecified foot: Secondary | ICD-10-CM

## 2016-01-28 MED ORDER — OXYCODONE-ACETAMINOPHEN 5-325 MG PO TABS: 1.0000 | ORAL_TABLET | Freq: Four times a day (QID) | ORAL | Status: DC | PRN

## 2016-02-01 ENCOUNTER — Encounter: Payer: Self-pay | Admitting: Podiatry

## 2016-02-01 NOTE — Progress Notes (Signed)
Patient ID: Devon Griffith, male   DOB: 1961/11/11, 55 y.o.   MRN: [id number]  Subjective: Devon Griffith is a 55 y.o. is seen today in office s/p left HAV and 2nd metatarsal osteotomy and hammertoe repair with Dr. Milinda Pointer.  He states he is doing well. He has been continuing and the CAM boot with the use of a knee scooter. He has had more active recently. He hasn't has status post possible. He states  That his pain is decreasing. He does get some pain mostly at the end of the day. No other complaints at this time. Denies any systemic complaints such as fevers, chills, nausea, vomiting. No calf pain, chest pain, shortness of breath.   Objective: General: No acute distress, AAOx3  DP/PT pulses palpable 2/4, CRT < 3 sec to all digits.  Protective sensation intact. Motor function intact.  left foot: Incision is well coapted without any evidence of dehiscence and suture ends intact. There is no surrounding erythema, ascending cellulitis, fluctuance, crepitus, malodor, drainage/purulence. There is mild edema around the surgical site. There is minimal pain along the surgical site.  Haywire intact of the toe  Without any surrounding redness or drainage or other signs of infection. Toes sit in a rectus position. No other areas of tenderness to bilateral lower extremities.  No other open lesions or pre-ulcerative lesions.  No pain with calf compression, swelling, warmth, erythema.   Assessment and Plan:  Status post left foot surgery, doing well with no complications   -Treatment options discussed including all alternatives, risks, and complications -Ice/elevation - suture is cut. Anabolic ointment was applied followed by a dressing. Keep the dressing clean, dry, intact. -Pain medication as needed. Refilled percocet today. Cannot work with pain medication - continued CAM boot and use of knee scooter. Aspirin daily. -Monitor for any clinical signs or symptoms of infection and DVT/PE and directed to call the  office immediately should any occur or go to the ER. -Follow-up in 2 weeks with Dr. Milinda Pointer or sooner if any problems arise. In the meantime, encouraged to call the office with any questions, concerns, change in symptoms.   Celesta Gentile, DPM

## 2016-02-03 ENCOUNTER — Encounter: Payer: Self-pay | Admitting: Podiatry

## 2016-02-10 ENCOUNTER — Ambulatory Visit (INDEPENDENT_AMBULATORY_CARE_PROVIDER_SITE_OTHER): Payer: Managed Care, Other (non HMO) | Admitting: Podiatry

## 2016-02-10 ENCOUNTER — Ambulatory Visit (INDEPENDENT_AMBULATORY_CARE_PROVIDER_SITE_OTHER): Payer: Managed Care, Other (non HMO)

## 2016-02-10 ENCOUNTER — Encounter: Payer: Self-pay | Admitting: Podiatry

## 2016-02-10 VITALS — BP 142/86 | HR 69 | Resp 12

## 2016-02-10 DIAGNOSIS — Z9889 Other specified postprocedural states: Secondary | ICD-10-CM | POA: Diagnosis not present

## 2016-02-10 DIAGNOSIS — M2012 Hallux valgus (acquired), left foot: Secondary | ICD-10-CM

## 2016-02-10 DIAGNOSIS — M2042 Other hammer toe(s) (acquired), left foot: Secondary | ICD-10-CM

## 2016-02-10 NOTE — Progress Notes (Signed)
He presents today for follow-up of his Liane Comber bunion repair second metatarsal osteotomy and hammertoe repair with pin left foot. He still states that he gets a little sore.  Objective: Vital signs are stable he is alert and oriented 3. Pulses are palpable. All of his sutures have been removed margins remain well coapted there is no erythema edema saline as drainage or odor.  Assessment: Well-healing surgical foot left.  Plan: Placed him in a compression anklet and a Darco shoe. I will follow-up with him in a couple of weeks for another set of x-rays and evaluation.

## 2016-02-29 ENCOUNTER — Ambulatory Visit (INDEPENDENT_AMBULATORY_CARE_PROVIDER_SITE_OTHER): Payer: Managed Care, Other (non HMO) | Admitting: Podiatry

## 2016-02-29 ENCOUNTER — Ambulatory Visit: Payer: Self-pay

## 2016-02-29 ENCOUNTER — Encounter: Payer: Self-pay | Admitting: Podiatry

## 2016-02-29 VITALS — BP 153/91 | HR 77 | Resp 16

## 2016-02-29 DIAGNOSIS — Z9889 Other specified postprocedural states: Secondary | ICD-10-CM

## 2016-02-29 DIAGNOSIS — M2012 Hallux valgus (acquired), left foot: Secondary | ICD-10-CM

## 2016-02-29 DIAGNOSIS — M2042 Other hammer toe(s) (acquired), left foot: Secondary | ICD-10-CM

## 2016-02-29 NOTE — Progress Notes (Signed)
Mr. Salom presents today for follow-up of his left foot. He is status post [place] bunion repair second metatarsal osteotomy and hammertoe repair second digit left foot. K wire is still intact and we are do to remove the K wire today. He's concerned about this and took a pain pill prior to his visit today. He denies fever chills nausea vomiting muscle aches and pains. He states that he surprised that the foot is still swollen.  Objective: Vital signs are stable he is alert and oriented 3. K wires intact of the second digit. Once removed today with little pain his foot sits rectus and he has great range of motion of the first and second metatarsophalangeal joints. Radiographs confirm well-healed arthrodesis site of the PIPJ second left.  Assessment: Well-healing surgical foot date of surgery 01/14/2016.  Plan: I will follow-up with him in 2 weeks I encouraged him to discontinue the use of the compression anklet and to continue the use of the Darco shoe.

## 2016-03-14 ENCOUNTER — Encounter: Payer: Self-pay | Admitting: Podiatry

## 2016-03-14 ENCOUNTER — Ambulatory Visit (INDEPENDENT_AMBULATORY_CARE_PROVIDER_SITE_OTHER): Payer: Managed Care, Other (non HMO) | Admitting: Podiatry

## 2016-03-14 ENCOUNTER — Ambulatory Visit (INDEPENDENT_AMBULATORY_CARE_PROVIDER_SITE_OTHER): Payer: Managed Care, Other (non HMO)

## 2016-03-14 VITALS — BP 132/84 | HR 64 | Resp 16

## 2016-03-14 DIAGNOSIS — M2012 Hallux valgus (acquired), left foot: Secondary | ICD-10-CM

## 2016-03-14 DIAGNOSIS — Z9889 Other specified postprocedural states: Secondary | ICD-10-CM

## 2016-03-14 DIAGNOSIS — M2042 Other hammer toe(s) (acquired), left foot: Secondary | ICD-10-CM

## 2016-03-14 DIAGNOSIS — B351 Tinea unguium: Secondary | ICD-10-CM

## 2016-03-14 NOTE — Addendum Note (Signed)
Addended by: Harriett Sine D on: 03/14/2016 03:32 PM   Modules accepted: Orders

## 2016-03-14 NOTE — Progress Notes (Signed)
He presents today 2 months status post bunion repair second metatarsal osteotomy and hammertoe repair left foot. He denies fevers chills nausea vomiting muscle aches and pains. He states that his toes feel stiff middle been well and he still feels some soreness.  Objective: All signs are stable alert and oriented 3. Pulses are palpable. As stiffness and tenderness on range of motion of the first and second metatarsophalangeal joints. He has wearing a pair of loafers today. I see no open wounds no cellulitis drainage or odor.  Assessment: Well-healing surgical foot some remaining tenderness.  Plan: Refer to physical therapy for increased range of motion and decreased swelling and pain. Follow up with him in 1-2 months.

## 2016-03-30 ENCOUNTER — Ambulatory Visit: Payer: Managed Care, Other (non HMO) | Admitting: Dietician

## 2016-04-13 ENCOUNTER — Ambulatory Visit: Payer: Managed Care, Other (non HMO) | Admitting: Podiatry

## 2016-04-25 ENCOUNTER — Ambulatory Visit: Payer: Managed Care, Other (non HMO) | Admitting: Podiatry

## 2016-05-04 ENCOUNTER — Encounter: Payer: Self-pay | Admitting: Podiatry

## 2016-05-04 ENCOUNTER — Ambulatory Visit (INDEPENDENT_AMBULATORY_CARE_PROVIDER_SITE_OTHER): Payer: Managed Care, Other (non HMO) | Admitting: Podiatry

## 2016-05-04 ENCOUNTER — Ambulatory Visit (INDEPENDENT_AMBULATORY_CARE_PROVIDER_SITE_OTHER): Payer: Managed Care, Other (non HMO)

## 2016-05-04 VITALS — BP 113/86 | HR 69 | Resp 12

## 2016-05-04 DIAGNOSIS — M76822 Posterior tibial tendinitis, left leg: Secondary | ICD-10-CM

## 2016-05-04 DIAGNOSIS — M2042 Other hammer toe(s) (acquired), left foot: Secondary | ICD-10-CM

## 2016-05-04 DIAGNOSIS — Z9889 Other specified postprocedural states: Secondary | ICD-10-CM

## 2016-05-04 DIAGNOSIS — M2012 Hallux valgus (acquired), left foot: Secondary | ICD-10-CM | POA: Diagnosis not present

## 2016-05-04 MED ORDER — DICLOFENAC SODIUM 1 % TD GEL: 4.0000 g | Freq: Four times a day (QID) | TRANSDERMAL | Status: DC

## 2016-05-04 MED ORDER — METHYLPREDNISOLONE 4 MG PO TBPK: ORAL_TABLET | ORAL | Status: DC

## 2016-05-04 NOTE — Progress Notes (Signed)
He presents today for follow-up of his surgical foot left. His primary concern today however is the severe pain that he has had over the past week or 2 out of his left ankle. He denies any trauma to the ankle but states that was red and swollen painful but seems to be healing to some degree at this point.  Objective: Vital signs are stable alert and oriented 3. Pulses are palpable. Moderate edema medial aspect of the foot with warmth on palpation along the posterior tibial tendon sheath from the medial malleolus extending to the navicular tuberosity. This area is painful on eversion against resistance and radiographs do not demonstrate any type of osseus abnormalities in the area. Soft tissue swelling is noted on radiographs only. Norma Fredrickson also demonstrate a well-healed Austin bunion repair and second metatarsal osteotomy.  Assessment: Well-healing surgical foot left possible posterior tibial tendinitis or gouty capsulitis of the left ankle.  Plan: I started him on a Medrol Dosepak and placed him in a Tri-Lock brace and I will follow-up with him in a couple 3 weeks. Should this become red hot and swollen again he is to come in and request a arthritic profile. Also we may need to consider an MRI of the left foot should he not get better in the near future.

## 2016-05-12 ENCOUNTER — Encounter: Payer: Self-pay | Admitting: Podiatry

## 2016-05-23 ENCOUNTER — Ambulatory Visit: Payer: Managed Care, Other (non HMO) | Admitting: Podiatry

## 2016-06-06 ENCOUNTER — Ambulatory Visit (INDEPENDENT_AMBULATORY_CARE_PROVIDER_SITE_OTHER): Payer: Managed Care, Other (non HMO) | Admitting: Podiatry

## 2016-06-06 ENCOUNTER — Telehealth: Payer: Self-pay | Admitting: *Deleted

## 2016-06-06 ENCOUNTER — Encounter: Payer: Self-pay | Admitting: Podiatry

## 2016-06-06 ENCOUNTER — Ambulatory Visit: Payer: Self-pay

## 2016-06-06 DIAGNOSIS — M76822 Posterior tibial tendinitis, left leg: Secondary | ICD-10-CM | POA: Diagnosis not present

## 2016-06-06 NOTE — Telephone Encounter (Addendum)
Orders for left ankle MRI given to [facility] for pre-cert. [id number] APPROVED MRI LEFT ANKLE [id number], AUTHORIZATION CASE NUMBER: [id number], VALID 06/16/2016 TO 09/14/2016. 06/29/2016-Pt called for the MRI results, states is having pain.  I told pt I could not give him the results of the MRI, but would transfer him to the scheduler for an appt with Dr. Milinda Pointer, and stay in the air fracture walker until seen by Dr. Milinda Pointer.  Pt states his foot hurts and I told him I would refill the Voltaren gel. Done. Pt called 5:06pm and asked if Dr. Milinda Pointer would be able to read the MRI before he came to his appt on 07/03/2016.  I told pt I would send him a message and let him know the results were available. 07/03/2016-Pt states he would like his medical records sent to Dr. Nona Dell. Pt states he signed a release recently.

## 2016-06-06 NOTE — Progress Notes (Signed)
He presents today for worsening of the medial ankle pain to his left foot. He states that he is recently been in the [place] and his foot has become so painful he could hardly walk the golf course. He states that he has utilizes cam walker on a regular basis to no avail. He states that is exquisitely tender on the medial aspect of the ankle and foot and feels that it is an area of weakness.  Objective: Vital signs are stable alert and oriented 3. Pulses are palpable. Neurologic sensorium is intact. Deep tendon reflexes are intact. Muscle strength is 5 over 5 dorsiflexion plantar flexors and inverters everters all intrinsic musculature is intact. Orthopedic evaluation of his resolved joints distal to the ankle for range of motion without crepitation. He does have a slightly weaker inversion against resistance with pain on palpation along the posterior tibial tendon as it courses from superior medial malleolar region to the navicular tuberosity.  Assessment: Posterior tibial tendinitis cannot rule out posterior tibial tendon dysfunction or a tear.  Plan: At this point I am going to request an MRI of the left ankle and foot to confirm a tear

## 2016-06-19 ENCOUNTER — Telehealth: Payer: Self-pay

## 2016-06-19 NOTE — Telephone Encounter (Signed)
MRI wo contrast Lt Prior Auth approval [id number] [employer] valid 06/16/16 thru 09/14/16, faxed orders to [facility]

## 2016-06-21 NOTE — Telephone Encounter (Signed)
Pt scheduled for MRI on 06/25/16 at 11:[facility]

## 2016-06-25 ENCOUNTER — Ambulatory Visit
Admission: RE | Admit: 2016-06-25 | Discharge: 2016-06-25 | Disposition: A | Discharge status: Home / Self Care | Payer: Managed Care, Other (non HMO) | Source: Ambulatory Visit | Attending: Podiatry | Admitting: Podiatry

## 2016-06-25 DIAGNOSIS — M76822 Posterior tibial tendinitis, left leg: Secondary | ICD-10-CM

## 2016-06-29 MED ORDER — DICLOFENAC SODIUM 1 % TD GEL: 4.0000 g | Freq: Four times a day (QID) | TRANSDERMAL | Status: DC

## 2016-07-03 ENCOUNTER — Encounter: Payer: Self-pay | Admitting: Podiatry

## 2016-07-03 ENCOUNTER — Ambulatory Visit (INDEPENDENT_AMBULATORY_CARE_PROVIDER_SITE_OTHER): Payer: Managed Care, Other (non HMO) | Admitting: Podiatry

## 2016-07-03 DIAGNOSIS — S86112D Strain of other muscle(s) and tendon(s) of posterior muscle group at lower leg level, left leg, subsequent encounter: Secondary | ICD-10-CM | POA: Diagnosis not present

## 2016-07-03 DIAGNOSIS — S86312D Strain of muscle(s) and tendon(s) of peroneal muscle group at lower leg level, left leg, subsequent encounter: Secondary | ICD-10-CM | POA: Diagnosis not present

## 2016-07-03 MED ORDER — MELOXICAM 15 MG PO TABS: 15.0000 mg | ORAL_TABLET | Freq: Every day | ORAL | Status: DC

## 2016-07-03 MED ORDER — METHYLPREDNISOLONE 4 MG PO TBPK: ORAL_TABLET | ORAL | Status: DC

## 2016-07-03 NOTE — Progress Notes (Signed)
[name] presents today with his wife for a concern of his left foot. He was seen in our [place] office today. He was upset that he was asked to make a copayment stating that this was caused by a surgical problem and that we caused his ankle pain which resulted an MRI and he will not pay the co-pay. He was also informed that he is over $1100 and he refused to make payments stating that we caused this problem. He goes on to say that he is recently been playing golf running using elliptical machines with his left foot. He is also been told to utilize his Cam Gilford Rile and he states that he has done so. He states that he is not aware of whether or not the cream actually many difference this far as his foot as he refers to the diclofenac gel which I prescribed. I asked him if he used to he stated that he did not remember. He is mildly confrontational today. He states that he played golf though he knew he should not play golf over the weekend after he was told by our nursing staff last week that he should remain in the boot. He states that he did not wear the boot to play golf.  Objective: Vital signs are stable he is alert and oriented 3. Pulses are palpable. Neurologic sensorium is intact. Deep tendon reflexes are intact. He has tenderness on palpation of the posterior tibial tendon as it courses from the superior medial malleolar region distally to the navicular tuberosity. The majority of his pain is proximal near the medial malleolus and just superior to that area. MRI does suggest significant split tear of the posterior tibial tendon as well as the peroneal tendon left. He has very minimal tenderness on palpation of the peroneal tendons left.  Assessment: Peroneal tendinitis with possible tear. Worse finding is the tear of the posterior tibial tendon left.  Plan: We discussed etiology pathology conservative versus surgical therapies. Making sure that he notes that this is more than likely a wear and tear  condyle problem not necessarily anything to do with his previous surgery and actually not associated with any surgical complication. More than likely this is associated with favoring of his foot prior to surgery and possibly after surgery. We discussed the surgical intervention today which would be consisting of a primary repair of the posterior tibial tendon left foot. We also discussed possible primary repair of the peroneal tendon upon evaluation intraoperatively. He understands that this will need a cast and he will be a nonweightbearing fashion. He has wife asked several questions and these were discussed thoroughly. He understands that he will need to be off of his foot at least 2 weeks prior to any travel and he will continue to have a cast on for 2-6 weeks total again nonweightbearing utilizing crutches or knee scooter. I recommended that he not travel for at least 2 weeks postoperatively. I will follow-up with him in the near future for surgical intervention consisting of a primary repair of posterior tibial tendon and Kidner procedure as well as evaluation of the peroneal tendons intraoperatively. Cast application as well. I expressed to him that this needs to be done sooner than later. However he was to wait until September to do this. I also recommended that we start on meloxicam and also prescribed a Medrol Dosepak for which she will utilize prior to his travel for [place] fishing trip.

## 2016-07-04 ENCOUNTER — Other Ambulatory Visit: Payer: Self-pay | Admitting: Cardiovascular Disease

## 2016-07-04 NOTE — Telephone Encounter (Signed)
Called patient to let him know that I had just faxed his records over to Dr. Doran Durand at [facility].

## 2016-07-04 NOTE — Telephone Encounter (Signed)
[name] Medical Records have been faxed to [facility] twice. I will re-fax them again by the end of the day.

## 2016-07-18 ENCOUNTER — Telehealth: Payer: Self-pay | Admitting: Podiatry

## 2016-07-18 NOTE — Telephone Encounter (Signed)
Patient left a message on medical records voicemail today wanting an actual copy of his MRI performed on 09 July to be sent to [facility] by this Friday 04 August for his 2nd opinion with Dr. Doran Durand. I have already faxed a copy of the results from the MRI to Dr. Nona Dell office. I called patient back and left a message to let him know that I would send a message to [place] one of our RN's and to [place], Dr. Stephenie Acres assistant to see if we have the disc in the office and if not, would he need to contact [facility] for that. I told the patient to call us back at the office if he has any other questions.

## 2016-07-19 ENCOUNTER — Telehealth: Payer: Self-pay | Admitting: *Deleted

## 2016-07-19 NOTE — Telephone Encounter (Addendum)
-----   Message from Cleon Gustin sent at 07/18/2016  5:04 PM EDT ----- Regarding: Copy of MRI from 09 July Contact: [phone] Patient called and left a message for me on the medical records voicemail today. He is wanting the actual MRI disc to take with him on Friday 04 August for his 2nd opinion with Dr. Doran Durand at [facility]. I've faxed them the report but he wants them to have it. Can one of you call him and let him know if it is okay to come pick it up or not? Or if he needs to contact [facility] to pick up. Please let him know ASAP. Thank you. 07/19/2016-Left message informing pt he could call ahead for his MRI disc copy and would be able to pick up in a day or two.

## 2016-08-02 ENCOUNTER — Other Ambulatory Visit: Payer: Self-pay | Admitting: Cardiovascular Disease

## 2016-08-04 ENCOUNTER — Telehealth: Payer: Self-pay | Admitting: *Deleted

## 2016-08-04 NOTE — Telephone Encounter (Signed)
"  There's a lady that just called, [name].  She's calling for her husband, Devon Griffith.  She's canceling his procedure.  She said he wants to go a different route.  He's a patient of Dr. Milinda Pointer."

## 2016-12-27 DIAGNOSIS — M2142 Flat foot [pes planus] (acquired), left foot: Secondary | ICD-10-CM | POA: Diagnosis not present

## 2016-12-27 DIAGNOSIS — Z4789 Encounter for other orthopedic aftercare: Secondary | ICD-10-CM | POA: Diagnosis not present

## 2017-01-22 DIAGNOSIS — M6281 Muscle weakness (generalized): Secondary | ICD-10-CM | POA: Diagnosis not present

## 2017-01-23 DIAGNOSIS — I10 Essential (primary) hypertension: Secondary | ICD-10-CM | POA: Diagnosis not present

## 2017-01-23 DIAGNOSIS — E7800 Pure hypercholesterolemia, unspecified: Secondary | ICD-10-CM | POA: Diagnosis not present

## 2017-01-23 DIAGNOSIS — Z0000 Encounter for general adult medical examination without abnormal findings: Secondary | ICD-10-CM | POA: Diagnosis not present

## 2017-01-30 DIAGNOSIS — M6281 Muscle weakness (generalized): Secondary | ICD-10-CM | POA: Diagnosis not present

## 2017-02-07 DIAGNOSIS — H6093 Unspecified otitis externa, bilateral: Secondary | ICD-10-CM | POA: Diagnosis not present

## 2017-02-09 DIAGNOSIS — G8929 Other chronic pain: Secondary | ICD-10-CM | POA: Diagnosis not present

## 2017-02-09 DIAGNOSIS — M6281 Muscle weakness (generalized): Secondary | ICD-10-CM | POA: Diagnosis not present

## 2017-02-09 DIAGNOSIS — M79672 Pain in left foot: Secondary | ICD-10-CM | POA: Diagnosis not present

## 2017-02-09 DIAGNOSIS — Z4789 Encounter for other orthopedic aftercare: Secondary | ICD-10-CM | POA: Diagnosis not present

## 2017-02-12 DIAGNOSIS — L570 Actinic keratosis: Secondary | ICD-10-CM | POA: Diagnosis not present

## 2017-02-12 DIAGNOSIS — L814 Other melanin hyperpigmentation: Secondary | ICD-10-CM | POA: Diagnosis not present

## 2017-02-12 DIAGNOSIS — D1801 Hemangioma of skin and subcutaneous tissue: Secondary | ICD-10-CM | POA: Diagnosis not present

## 2017-02-15 DIAGNOSIS — M6281 Muscle weakness (generalized): Secondary | ICD-10-CM | POA: Diagnosis not present

## 2017-02-19 ENCOUNTER — Encounter: Payer: Self-pay | Admitting: Cardiovascular Disease

## 2017-02-19 ENCOUNTER — Ambulatory Visit (INDEPENDENT_AMBULATORY_CARE_PROVIDER_SITE_OTHER): Payer: 59 | Admitting: Cardiovascular Disease

## 2017-02-19 VITALS — BP 115/76 | HR 68 | Ht 71.0 in | Wt 258.0 lb

## 2017-02-19 DIAGNOSIS — E559 Vitamin D deficiency, unspecified: Secondary | ICD-10-CM | POA: Diagnosis not present

## 2017-02-19 DIAGNOSIS — Z6835 Body mass index (BMI) 35.0-35.9, adult: Secondary | ICD-10-CM | POA: Diagnosis not present

## 2017-02-19 DIAGNOSIS — G4733 Obstructive sleep apnea (adult) (pediatric): Secondary | ICD-10-CM | POA: Diagnosis not present

## 2017-02-19 DIAGNOSIS — E6609 Other obesity due to excess calories: Secondary | ICD-10-CM | POA: Diagnosis not present

## 2017-02-19 DIAGNOSIS — I10 Essential (primary) hypertension: Secondary | ICD-10-CM | POA: Diagnosis not present

## 2017-02-19 DIAGNOSIS — E7800 Pure hypercholesterolemia, unspecified: Secondary | ICD-10-CM | POA: Diagnosis not present

## 2017-02-19 NOTE — Patient Instructions (Signed)
Your physician wants you to follow-up in: 1 year or sooner if needed. You will receive a reminder letter in the mail two months in advance. If you don't receive a letter, please call our office to schedule the follow-up appointment.   If you need a refill on your cardiac medications before your next appointment, please call your pharmacy.   
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
## 2017-03-01 DIAGNOSIS — M6281 Muscle weakness (generalized): Secondary | ICD-10-CM | POA: Diagnosis not present

## 2017-04-19 DIAGNOSIS — R21 Rash and other nonspecific skin eruption: Secondary | ICD-10-CM | POA: Diagnosis not present

## 2017-05-15 DIAGNOSIS — S39012A Strain of muscle, fascia and tendon of lower back, initial encounter: Secondary | ICD-10-CM | POA: Diagnosis not present

## 2017-05-21 ENCOUNTER — Other Ambulatory Visit: Payer: Self-pay | Admitting: Cardiovascular Disease

## 2017-08-13 DIAGNOSIS — H6692 Otitis media, unspecified, left ear: Secondary | ICD-10-CM | POA: Diagnosis not present

## 2017-08-15 DIAGNOSIS — Z719 Counseling, unspecified: Secondary | ICD-10-CM | POA: Diagnosis not present

## 2017-09-27 DIAGNOSIS — Z87442 Personal history of urinary calculi: Secondary | ICD-10-CM | POA: Diagnosis not present

## 2017-11-15 DIAGNOSIS — J069 Acute upper respiratory infection, unspecified: Secondary | ICD-10-CM | POA: Diagnosis not present

## 2017-11-15 DIAGNOSIS — L03012 Cellulitis of left finger: Secondary | ICD-10-CM | POA: Diagnosis not present

## 2017-12-21 DIAGNOSIS — Z0000 Encounter for general adult medical examination without abnormal findings: Secondary | ICD-10-CM | POA: Diagnosis not present

## 2017-12-21 DIAGNOSIS — E7800 Pure hypercholesterolemia, unspecified: Secondary | ICD-10-CM | POA: Diagnosis not present

## 2017-12-21 DIAGNOSIS — Z23 Encounter for immunization: Secondary | ICD-10-CM | POA: Diagnosis not present

## 2018-03-11 ENCOUNTER — Telehealth: Payer: Self-pay | Admitting: Cardiovascular Disease

## 2018-03-11 DIAGNOSIS — I6789 Other cerebrovascular disease: Secondary | ICD-10-CM | POA: Diagnosis not present

## 2018-03-11 DIAGNOSIS — R202 Paresthesia of skin: Secondary | ICD-10-CM | POA: Diagnosis not present

## 2018-03-11 NOTE — Telephone Encounter (Signed)
Patient wife, Anderson Malta calling  Patient c/o Palpitations:  High priority if patient c/o lightheadedness, shortness of breath, or chest pain  1) How long have you had palpitations/irregular HR/ Afib? Are you having the symptoms now? Since yesterday   2) Are you currently experiencing lightheadedness, SOB or CP? No   3) Do you have a history of afib (atrial fibrillation) or irregular heart rhythm?   4) Have you checked your BP or HR? (document readings if available): n/a   5) Are you experiencing any other symptoms? BP was high
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
## 2018-03-11 NOTE — Telephone Encounter (Signed)
.    Will try to work in the office either this week or next

## 2018-03-12 NOTE — Telephone Encounter (Signed)
Left message to call back on wife's cell phone as I spoke with her yesterday. T  Left detailed message for patient  making him aware he is scheduled an appointment with Dr. Claiborne Billings on Thursday 3/28 @ 2 pm. Asked him to arrive 15 minutes early. Also, told patient if he is unable to make this appointment to please call and cancel.  I did not cancel is 4/9 appointment with Dr. Claiborne Billings in case it is needed as a f/u from this appointment, can be cancelled during 3/28 appt.

## 2018-03-14 ENCOUNTER — Ambulatory Visit: Payer: 59 | Admitting: Cardiovascular Disease

## 2018-03-14 ENCOUNTER — Telehealth: Payer: Self-pay | Admitting: Cardiovascular Disease

## 2018-03-14 ENCOUNTER — Encounter: Payer: Self-pay | Admitting: Cardiovascular Disease

## 2018-03-14 VITALS — BP 124/70 | HR 77 | Ht 71.0 in | Wt 271.0 lb

## 2018-03-14 DIAGNOSIS — I10 Essential (primary) hypertension: Secondary | ICD-10-CM | POA: Diagnosis not present

## 2018-03-14 DIAGNOSIS — G4733 Obstructive sleep apnea (adult) (pediatric): Secondary | ICD-10-CM | POA: Diagnosis not present

## 2018-03-14 DIAGNOSIS — R079 Chest pain, unspecified: Secondary | ICD-10-CM

## 2018-03-14 DIAGNOSIS — Z79899 Other long term (current) drug therapy: Secondary | ICD-10-CM

## 2018-03-14 DIAGNOSIS — Z6836 Body mass index (BMI) 36.0-36.9, adult: Secondary | ICD-10-CM

## 2018-03-14 DIAGNOSIS — E785 Hyperlipidemia, unspecified: Secondary | ICD-10-CM | POA: Diagnosis not present

## 2018-03-14 MED ORDER — DILTIAZEM HCL ER COATED BEADS 240 MG PO CP24: 240.0000 mg | ORAL_CAPSULE | Freq: Every day | ORAL | 3 refills | Status: DC

## 2018-03-14 NOTE — Telephone Encounter (Signed)
Okay to resume workouts with a trainer

## 2018-03-14 NOTE — Telephone Encounter (Signed)
New message  Pt verbalized that he is calling for RN   He need to know if he can resume his regular activities with no restrictions   In a email to [email or web address] or in witting for his trainer
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
## 2018-03-14 NOTE — Patient Instructions (Addendum)
Your physician has recommended you make the following change in your medication: Increase Cardizem 240mg  daily  Your physician recommends that you return for fasting lab (Cmet, CBC, Lipids, TSH, Mg)  In Office lab open Mon-Fri 8am-4pm closed for lunch 12:45-1:45pm( No appointment needed.  Your physician recommends that you schedule a follow-up appointment in: 6 weeks with Dr. Claiborne Billings (Bring CPAP Machine)

## 2018-03-14 NOTE — Telephone Encounter (Signed)
Patient seen in office today by Dr. Claiborne Billings  Routed to MD/RN to advise

## 2018-03-15 ENCOUNTER — Telehealth: Payer: Self-pay | Admitting: Cardiovascular Disease

## 2018-03-15 ENCOUNTER — Telehealth: Payer: Self-pay | Admitting: *Deleted

## 2018-03-15 ENCOUNTER — Encounter: Payer: Self-pay | Admitting: *Deleted

## 2018-03-15 NOTE — Telephone Encounter (Signed)
Patient aware letter printed and placed at front desk for pickup.  Patient also states he tried to use CPAP machine last night and it is broken, states the motor is gone and will not turn on.   Advised I would route to sleep coordinator to follow up on.  He has has his machine for 8 years

## 2018-03-15 NOTE — Telephone Encounter (Signed)
Faxed order to [facility] for Dreamwear or ResMed nasal mask. N-30 I.

## 2018-03-15 NOTE — Telephone Encounter (Signed)
New message     Please fax letter to gym so that patient can resume working out , it is at the front desk waiting   Fax [phone]

## 2018-03-15 NOTE — Telephone Encounter (Signed)
Letter faxed.

## 2018-03-16 ENCOUNTER — Encounter: Payer: Self-pay | Admitting: Cardiovascular Disease

## 2018-03-19 NOTE — Telephone Encounter (Signed)
Patient notified a order for a new CPAP machine and supplies was sent to [facility] today Attention: Mickel Baas. He was given their contact information to follow up and check status of the machine if he hasn't heard back from them within 1 week.

## 2018-03-25 DIAGNOSIS — Z86010 Personal history of colonic polyps: Secondary | ICD-10-CM | POA: Diagnosis not present

## 2018-03-25 DIAGNOSIS — D126 Benign neoplasm of colon, unspecified: Secondary | ICD-10-CM | POA: Diagnosis not present

## 2018-03-26 ENCOUNTER — Ambulatory Visit: Payer: 59 | Admitting: Cardiovascular Disease

## 2018-03-27 ENCOUNTER — Telehealth: Payer: Self-pay | Admitting: *Deleted

## 2018-03-27 DIAGNOSIS — G4733 Obstructive sleep apnea (adult) (pediatric): Secondary | ICD-10-CM | POA: Diagnosis not present

## 2018-03-27 NOTE — Telephone Encounter (Signed)
Faxed CPAP supply order to [facility].

## 2018-04-18 ENCOUNTER — Encounter: Payer: Self-pay | Admitting: Cardiovascular Disease

## 2018-04-18 ENCOUNTER — Ambulatory Visit: Payer: 59 | Admitting: Cardiovascular Disease

## 2018-04-18 VITALS — BP 122/78 | HR 64 | Ht 71.0 in | Wt 270.6 lb

## 2018-04-18 DIAGNOSIS — E785 Hyperlipidemia, unspecified: Secondary | ICD-10-CM

## 2018-04-18 DIAGNOSIS — I10 Essential (primary) hypertension: Secondary | ICD-10-CM

## 2018-04-18 DIAGNOSIS — G4733 Obstructive sleep apnea (adult) (pediatric): Secondary | ICD-10-CM | POA: Diagnosis not present

## 2018-04-18 DIAGNOSIS — Z6836 Body mass index (BMI) 36.0-36.9, adult: Secondary | ICD-10-CM | POA: Diagnosis not present

## 2018-04-18 NOTE — Patient Instructions (Addendum)
Medication Instructions:  Your physician recommends that you continue on your current medications as directed. Please refer to the Current Medication list given to you today.  Follow-Up: 3 months with Dr. Kelly   If you need a refill on your cardiac medications before your next appointment, please call your pharmacy.   
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
## 2018-04-20 ENCOUNTER — Encounter: Payer: Self-pay | Admitting: Cardiovascular Disease

## 2018-04-26 DIAGNOSIS — G4733 Obstructive sleep apnea (adult) (pediatric): Secondary | ICD-10-CM | POA: Diagnosis not present

## 2018-05-24 ENCOUNTER — Other Ambulatory Visit: Payer: Self-pay | Admitting: Cardiovascular Disease

## 2018-05-24 NOTE — Telephone Encounter (Signed)
Rx sent to pharmacy   

## 2018-05-27 DIAGNOSIS — G4733 Obstructive sleep apnea (adult) (pediatric): Secondary | ICD-10-CM | POA: Diagnosis not present

## 2018-06-26 DIAGNOSIS — G4733 Obstructive sleep apnea (adult) (pediatric): Secondary | ICD-10-CM | POA: Diagnosis not present

## 2018-07-27 DIAGNOSIS — G4733 Obstructive sleep apnea (adult) (pediatric): Secondary | ICD-10-CM | POA: Diagnosis not present

## 2018-08-22 ENCOUNTER — Ambulatory Visit: Payer: 59 | Admitting: Cardiovascular Disease

## 2018-08-22 ENCOUNTER — Encounter: Payer: Self-pay | Admitting: Cardiovascular Disease

## 2018-08-22 VITALS — BP 94/62 | HR 58 | Ht 71.0 in | Wt 255.8 lb

## 2018-08-22 DIAGNOSIS — G4733 Obstructive sleep apnea (adult) (pediatric): Secondary | ICD-10-CM

## 2018-08-22 DIAGNOSIS — I10 Essential (primary) hypertension: Secondary | ICD-10-CM | POA: Diagnosis not present

## 2018-08-22 DIAGNOSIS — E785 Hyperlipidemia, unspecified: Secondary | ICD-10-CM

## 2018-08-22 DIAGNOSIS — F419 Anxiety disorder, unspecified: Secondary | ICD-10-CM

## 2018-08-22 DIAGNOSIS — Z6836 Body mass index (BMI) 36.0-36.9, adult: Secondary | ICD-10-CM
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
## 2018-08-22 NOTE — Patient Instructions (Signed)

## 2018-08-23 ENCOUNTER — Telehealth: Payer: Self-pay | Admitting: *Deleted

## 2018-08-23 NOTE — Telephone Encounter (Signed)
Faxed supply order along with 08/22/18 office note to [facility].

## 2018-08-27 DIAGNOSIS — G4733 Obstructive sleep apnea (adult) (pediatric): Secondary | ICD-10-CM | POA: Diagnosis not present

## 2018-09-09 ENCOUNTER — Encounter: Payer: Self-pay | Admitting: Cardiovascular Disease

## 2018-09-13 DIAGNOSIS — G4733 Obstructive sleep apnea (adult) (pediatric): Secondary | ICD-10-CM | POA: Diagnosis not present

## 2018-09-19 DIAGNOSIS — N401 Enlarged prostate with lower urinary tract symptoms: Secondary | ICD-10-CM | POA: Diagnosis not present

## 2018-09-19 DIAGNOSIS — N200 Calculus of kidney: Secondary | ICD-10-CM | POA: Diagnosis not present

## 2018-09-19 DIAGNOSIS — R351 Nocturia: Secondary | ICD-10-CM | POA: Diagnosis not present

## 2018-09-23 ENCOUNTER — Other Ambulatory Visit: Payer: Self-pay | Admitting: Urology

## 2018-09-24 ENCOUNTER — Other Ambulatory Visit: Payer: Self-pay

## 2018-09-24 ENCOUNTER — Emergency Department (HOSPITAL_COMMUNITY): Payer: 59

## 2018-09-24 ENCOUNTER — Emergency Department (HOSPITAL_COMMUNITY)
Admission: EM | Admit: 2018-09-24 | Discharge: 2018-09-24 | Disposition: A | Discharge status: Home / Self Care | Payer: 59 | Attending: Emergency Medicine | Admitting: Emergency Medicine

## 2018-09-24 ENCOUNTER — Encounter (HOSPITAL_COMMUNITY): Payer: Self-pay | Admitting: Emergency Medicine

## 2018-09-24 DIAGNOSIS — R0600 Dyspnea, unspecified: Secondary | ICD-10-CM | POA: Diagnosis not present

## 2018-09-24 DIAGNOSIS — R911 Solitary pulmonary nodule: Secondary | ICD-10-CM | POA: Diagnosis not present

## 2018-09-24 DIAGNOSIS — I10 Essential (primary) hypertension: Secondary | ICD-10-CM | POA: Insufficient documentation

## 2018-09-24 DIAGNOSIS — R0602 Shortness of breath: Secondary | ICD-10-CM | POA: Diagnosis not present

## 2018-09-24 LAB — CBC WITH DIFFERENTIAL/PLATELET
ABS IMMATURE GRANULOCYTES: 0.04 10*3/uL (ref 0.00–0.07)
Basophils Absolute: 0 10*3/uL (ref 0.0–0.1)
Basophils Relative: 0 %
EOS PCT: 0 %
Eosinophils Absolute: 0 10*3/uL (ref 0.0–0.5)
HEMATOCRIT: 45.6 % (ref 39.0–52.0)
Hemoglobin: 15.8 g/dL (ref 13.0–17.0)
Immature Granulocytes: 1 %
LYMPHS ABS: 2.1 10*3/uL (ref 0.7–4.0)
Lymphocytes Relative: 26 %
MCH: 30.6 pg (ref 26.0–34.0)
MCHC: 34.6 g/dL (ref 30.0–36.0)
MCV: 88.2 fL (ref 80.0–100.0)
MONO ABS: 0.7 10*3/uL (ref 0.1–1.0)
Monocytes Relative: 9 %
Neutro Abs: 5.2 10*3/uL (ref 1.7–7.7)
Neutrophils Relative %: 64 %
Platelets: 250 10*3/uL (ref 150–400)
RBC: 5.17 MIL/uL (ref 4.22–5.81)
RDW: 12.5 % (ref 11.5–15.5)
WBC: 8 10*3/uL (ref 4.0–10.5)
nRBC: 0 % (ref 0.0–0.2)

## 2018-09-24 LAB — BASIC METABOLIC PANEL
Anion gap: 12 (ref 5–15)
BUN: 24 mg/dL — AB (ref 6–20)
CHLORIDE: 106 mmol/L (ref 98–111)
CO2: 20 mmol/L — AB (ref 22–32)
CREATININE: 1.15 mg/dL (ref 0.61–1.24)
Calcium: 9.8 mg/dL (ref 8.9–10.3)
GFR calc Af Amer: >60 mL/min (ref >60)
GFR calc non Af Amer: >60 mL/min (ref >60)
Glucose, Bld: 77 mg/dL (ref 70–99)
POTASSIUM: 4.2 mmol/L (ref 3.5–5.1)
Sodium: 138 mmol/L (ref 135–145)

## 2018-09-24 LAB — I-STAT TROPONIN, ED
TROPONIN I, POC: 0 ng/mL (ref 0.00–0.08)
Troponin i, poc: 0 ng/mL (ref 0.00–0.08)

## 2018-09-24 LAB — D-DIMER, QUANTITATIVE: D-Dimer, Quant: 0.85 ug/mL-FEU — ABNORMAL HIGH (ref 0.00–0.50)

## 2018-09-24 LAB — BRAIN NATRIURETIC PEPTIDE: B Natriuretic Peptide: 25.7 pg/mL (ref 0.0–100.0)

## 2018-09-24 MED ORDER — IOPAMIDOL (ISOVUE-370) INJECTION 76%
100.0000 mL | Freq: Once | INTRAVENOUS | Status: AC | PRN
  Administered 2018-09-24: 75 mL via INTRAVENOUS

## 2018-09-24 NOTE — ED Notes (Signed)
Pt ambulated in hallway with ease. Pt hasd no complaints of CP or SOB. O2 sats stayed above 95%.

## 2018-09-24 NOTE — ED Notes (Signed)
ED Provider at bedside. 

## 2018-09-24 NOTE — ED Triage Notes (Signed)
Pt reports sob that has been going on for the last 2-3 days. Pt reports some bilateral foot swelling. Respirations unlabored and regular. Lung sounds clear in triage. Pt reports it feels like he can't keep a deep breath, wife says shallow breathing at night. Pt sleeps with a CPAP every night, reports he is sleeping better lying flat instead of sitting. Hx of HTN and is compliant with medications.

## 2018-09-24 NOTE — ED Provider Notes (Signed)
Patient placed in Quick Look pathway, seen and evaluated   Chief Complaint: SOB  HPI:   SOB for 2-3 days.  Worse with sitting up and at rest, states when he sits up or leans forward his chest feels constricted.  Better if he lays flat or walks around. States he keeps yawning and it feels better. Significant stress at work.   ROS: negative: exertional CP, nausea, vomiting, light-headedness, cough, fever, orthopnea.  Slight LE swelling, long term.   Physical Exam:   Gen: No distress  Neuro: Awake and Alert  Skin: Warm    Focused Exam: RRR. Lungs CTAB. No LE edema or calf tenderness.    Initiation of care has begun. The patient has been counseled on the process, plan, and necessity for staying for the completion/evaluation, and the remainder of the medical screening examination    Arlean Hopping 09/24/18 Edsel Petrin, MD 09/25/18 [phone]

## 2018-09-24 NOTE — Discharge Instructions (Addendum)
Work-up today was very reassuring, labs, EKG, chest x-ray and CT scan do not show evidence of an acute problem with your heart or lungs causing your symptoms today.  Your CT scan did show a very small pulmonary nodule, which I would like free to follow-up with your primary care doctor regarding.  Return for worsening shortness of breath, chest pain, lightheadedness or dizziness, worsening leg swelling or any other new or concerning symptoms.

## 2018-09-24 NOTE — ED Provider Notes (Signed)
[place] EMERGENCY DEPARTMENT Provider Note   CSN: [id number] Arrival date & time: 09/24/18  1648     History   Chief Complaint Chief Complaint  Patient presents with  . Shortness of Breath    HPI Devon Griffith is a 57 y.o. male.  Devon Griffith is a 57 y.o. Male history of hypertension, hyperlipidemia, sleep apnea, and kidney stones, who presents to the emergency department for evaluation of shortness of breath.  Patient reports for the last 3 days he has had persistent shortness of breath.  He reports he feels like his chest is constricted and he cannot take a deep breath, but he does not have focal chest pain.  He reports symptoms feel better when he lays down and worse when he sits up or leans forward.  Walking around also seems to help his pain.  He reports he feels like he can get in enough air when he yawns and this helps with his symptoms.  He denies feeling lightheaded or any syncopal episodes.  No cough or fevers, no hemoptysis.  No abdominal pain, nausea or vomiting.  No diaphoresis.  Symptoms are not worse with exertion.  He reports that he has had some slight swelling in both lower extremities for several years and this is unchanged.  No history of smoking.  No history of MI.  Does wear a CPAP at night.  He reports significant stress at work over the past week and wonders if this could be contributing, also recently was started on Wellbutrin for depression and anxiety.     Past Medical History:  Diagnosis Date  . Arrhythmia    wore monitor 2-3 weeks.aprox 5 years ago.  . H/O hiatal hernia    hx of  . Hyperlipidemia   . Hypertension   . Kidney stones   . Knee pain   . Pain in limb   . Sleep apnea    CPAP  settings at 10   . Urinary frequency     Patient Active Problem List   Diagnosis Date Noted  . Obstructive sleep apnea 12/26/2014  . Chest pain 05/09/2013  . Essential hypertension 05/09/2013  . Hyperlipemia 05/09/2013  . Obesity 05/09/2013   . Varicose veins of lower extremities with other complications 53/29/9242    Past Surgical History:  Procedure Laterality Date  . HERNIA REPAIR  2000  . KNEE ARTHROSCOPY W/ ACL RECONSTRUCTION  2002   left knee  . LITHOTRIPSY  2002  . [facility]ICOSE VEIN SURGERY  [facility]   [facility]        Home Medications    Prior to Admission medications   Medication Sig Start Date End Date Taking? Authorizing Provider  buPROPion (WELLBUTRIN XL) 150 MG 24 hr tablet TAKE 1 TABLET BY MOUTH ONCE DAILY IN THE MORNING 08/15/18   [provider]  diltiazem (CARDIZEM CD) 240 MG 24 hr capsule Take 1 capsule (240 mg total) by mouth daily. 03/14/18   Troy Sine, MD  rosuvastatin (CRESTOR) 20 MG tablet TAKE 1 TABLET BY MOUTH ONCE DAILY 05/24/18   Troy Sine, MD  Vitamin D, Ergocalciferol, (DRISDOL) 50000 units CAPS capsule Take 50,000 Units by mouth every 7 (seven) days.    [provider]    Family History Family History  Problem Relation Age of Onset  . Cancer Mother   . Emphysema Father     Social History Social History   Tobacco Use  . Smoking status: Never Smoker  .  Smokeless tobacco: Never Used  Substance Use Topics  . Alcohol use: Not Currently    Alcohol/week: 12.0 standard drinks    Types: 12 Standard drinks or equivalent per week  . Drug use: No     Allergies   Patient has no known allergies.   Review of Systems Review of Systems  Constitutional: Negative for chills and fever.  HENT: Negative.   Eyes: Negative for visual disturbance.  Respiratory: Positive for cough, chest tightness and shortness of breath. Negative for wheezing.   Cardiovascular: Negative for chest pain, palpitations and leg swelling.  Gastrointestinal: Negative for abdominal pain, nausea and vomiting.  Genitourinary: Negative for dysuria.  Musculoskeletal: Negative for arthralgias, back pain and neck pain.  Skin: Negative for color change and rash.  Neurological: Negative  for dizziness, syncope and light-headedness.  All other systems reviewed and are negative.    Physical Exam Updated Vital Signs BP 128/82 (BP Location: Left Arm)   Pulse 61   Temp 98.8 F (37.1 C) (Oral)   Resp 16   Ht 5\' 11"  (1.803 m)   Wt 112.5 kg   SpO2 100%   BMI 34.59 kg/m   Physical Exam  Constitutional: He is oriented to person, place, and time. He appears well-developed and well-nourished. No distress.  HENT:  Head: Normocephalic and atraumatic.  Mouth/Throat: Oropharynx is clear and moist.  Eyes: Right eye exhibits no discharge. Left eye exhibits no discharge.  Neck: Normal range of motion. Neck supple. No JVD present. No tracheal deviation present.  Cardiovascular: Normal rate, regular rhythm, normal heart sounds and intact distal pulses. Exam reveals no gallop and no friction rub.  No murmur heard. Pulmonary/Chest: Effort normal. No respiratory distress. He exhibits no tenderness.  Respirations equal and unlabored, patient able to speak in full sentences, lungs clear to auscultation bilaterally  Abdominal: Soft. Bowel sounds are normal. He exhibits no distension and no mass. There is no tenderness. There is no guarding.  Abdomen soft, nondistended, nontender to palpation in all quadrants without guarding or peritoneal signs  Musculoskeletal:  Minimal edema over bilateral ankles with no overlying erythema, warmth or tenderness  Neurological: He is alert and oriented to person, place, and time. Coordination normal.  Skin: Skin is warm and dry. Capillary refill takes less than 2 seconds. He is not diaphoretic.  Psychiatric: He has a normal mood and affect. His behavior is normal.  Nursing note and vitals reviewed.    ED Treatments / Results  Labs (all labs ordered are listed, but only abnormal results are displayed) Labs Reviewed  BASIC METABOLIC PANEL - Abnormal; Notable for the following components:      Result Value   CO2 20 (*)    BUN 24 (*)    All other  components within normal limits  D-DIMER, QUANTITATIVE (NOT AT [facility]) - Abnormal; Notable for the following components:   D-Dimer, Quant 0.85 (*)    All other components within normal limits  CBC WITH DIFFERENTIAL/PLATELET  BRAIN NATRIURETIC PEPTIDE  I-STAT TROPONIN, ED  I-STAT TROPONIN, ED    EKG EKG Interpretation  Date/Time:  Tuesday September 24 2018 16:51:56 EDT Ventricular Rate:  66 PR Interval:  160 QRS Duration: 86 QT Interval:  392 QTC Calculation: 410 R Axis:   4 Text Interpretation:  Normal sinus rhythm Normal ECG Since last tracing rate faster Confirmed by Dorie Rank [phone]) on 09/24/2018 8:35:07 PM Also confirmed by Dorie Rank [phone]), editor Philomena Doheny [phone])  on 09/25/2018 8:24:05 AM   Radiology Dg  Chest 2 View  Result Date: 09/24/2018 CLINICAL DATA:  Dyspnea for the past 3 days EXAM: CHEST - 2 VIEW COMPARISON:  08/05/2009 FINDINGS: The heart size and mediastinal contours are within normal limits. Both lungs are clear. The visualized skeletal structures are unremarkable. IMPRESSION: No active cardiopulmonary disease. Electronically Signed   By: Ashley Royalty M.D.   On: 09/24/2018 18:05   Ct Angio Chest Pe W And/or Wo Contrast  Result Date: 09/24/2018 CLINICAL DATA:  Shortness of breath for 2-3 days. EXAM: CT ANGIOGRAPHY CHEST WITH CONTRAST TECHNIQUE: Multidetector CT imaging of the chest was performed using the standard protocol during bolus administration of intravenous contrast. Multiplanar CT image reconstructions and MIPs were obtained to evaluate the vascular anatomy. CONTRAST:  39mL ISOVUE-370 IOPAMIDOL (ISOVUE-370) INJECTION 76% COMPARISON:  None. FINDINGS: Cardiovascular: --Pulmonary arteries: Contrast injection is sufficient to demonstrate satisfactory opacification of the pulmonary arteries to the segmental level. There is no pulmonary embolus. The main pulmonary artery is within normal limits for size. --Aorta: Limited opacification of the aorta due to bolus  timing optimization for the pulmonary arteries. Conventional 3 vessel aortic branching pattern. The aortic course and caliber are normal. There is no aortic atherosclerosis. --Heart: Normal size. No pericardial effusion. Mediastinum/Nodes: No mediastinal, hilar or axillary lymphadenopathy. The visualized thyroid and thoracic esophageal course are unremarkable. Lungs/Pleura: 5 mm subpleural right middle lobe nodule. No pleural effusion or pneumothorax. No focal airspace consolidation. No focal pleural abnormality. Upper Abdomen: Contrast bolus timing is not optimized for evaluation of the abdominal organs. Within this limitation, the visualized organs of the upper abdomen are normal. Musculoskeletal: No chest wall abnormality. No acute or significant osseous findings. Review of the MIP images confirms the above findings. IMPRESSION: 1. No pulmonary embolus or other acute thoracic abnormality. 2. 5 mm subpleural right middle lobe nodule. No follow-up needed if patient is low-risk. Non-contrast chest CT can be considered in 12 months if patient is high-risk. This recommendation follows the consensus statement: Guidelines for Management of Incidental Pulmonary Nodules Detected on CT Images: From the Fleischner Society 2017; Radiology 2017; 284:228-243. Electronically Signed   By: Ulyses Jarred M.D.   On: 09/24/2018 22:19    Procedures Procedures (including critical care time)  Medications Ordered in ED Medications  iopamidol (ISOVUE-370) 76 % injection 100 mL (75 mLs Intravenous Contrast Given 09/24/18 2154)     Initial Impression / Assessment and Plan / ED Course  I have reviewed the triage vital signs and the nursing notes.  Pertinent labs & imaging results that were available during my care of the patient were reviewed by me and considered in my medical decision making (see chart for details).  Presents for evaluation of 3 days of shortness of breath.  This is constant, and worse with sitting up,  seems to feel improved when he lays flat or walks around.  No associated chest pain.  No fevers or chills, occasional cough.  On arrival patient has normal vitals and appears to be in no acute distress, heart with regular rate and rhythm and lungs clear to auscultation patient has very minimal bilateral lower extremity edema which is chronic in nature.  No recent long distance travel or surgeries, no history of PE or DVT, no history of ACS.  Does have sleep apnea and wears CPAP nightly.  Reports increased stress at work.  Basic labs, troponin, EKG and BNP obtained from triage as well as chest x-ray.  Lab work-up is very reassuring, no leukocytosis and normal hemoglobin, no acute  electrolyte derangements and normal renal function, initial troponin is negative and EKG without arrhythmia or concerning ischemic changes.  BNP is not elevated.  Chest x-ray is clear with no active cardiopulmonary disease.  Given that primary symptom is shortness of breath with no other focal cause we will also get d-dimer.  D-dimer elevated at 0.85 proceeded with CTA of the chest which shows no evidence of pulmonary embolus or other acute thoracic abnormality there was a 5 mm subpleural right middle lobe nodule noted will have patient follow-up with primary care doctor regarding this.  Work-up has been very reassuring and does not suggest an acute emergent medical condition causing patient's symptoms.  He is breathing comfortably and has maintained normal vitals here in the emergency department he was ambulated throughout the department maintaining O2 saturations greater than 95 without significant elevation in heart rate.  At this time I feel the patient is stable for discharge home with close follow-up with his primary care provider I have discussed appropriate return precautions and patient expresses understanding and is in agreement with plan.  Case discussed with Dr. Tomi Bamberger who is in agreement with plan.  Final Clinical  Impressions(s) / ED Diagnoses   Final diagnoses:  SOB (shortness of breath)  Pulmonary nodule    ED Discharge Orders    None       Jacqlyn Larsen, [place] 09/26/18 0113    Dorie Rank, MD 09/27/18 1113

## 2018-09-26 DIAGNOSIS — G4733 Obstructive sleep apnea (adult) (pediatric): Secondary | ICD-10-CM | POA: Diagnosis not present

## 2018-09-27 DIAGNOSIS — R0600 Dyspnea, unspecified: Secondary | ICD-10-CM | POA: Diagnosis not present

## 2018-09-27 DIAGNOSIS — Z23 Encounter for immunization: Secondary | ICD-10-CM | POA: Diagnosis not present

## 2018-10-14 ENCOUNTER — Encounter (HOSPITAL_COMMUNITY)
Admission: RE | Disposition: A | Discharge status: Home / Self Care | Payer: Self-pay | Source: Ambulatory Visit | Attending: Urology

## 2018-10-14 ENCOUNTER — Ambulatory Visit (HOSPITAL_COMMUNITY): Payer: 59

## 2018-10-14 ENCOUNTER — Encounter (HOSPITAL_COMMUNITY): Payer: Self-pay | Admitting: General Practice

## 2018-10-14 ENCOUNTER — Ambulatory Visit (HOSPITAL_COMMUNITY)
Admission: RE | Admit: 2018-10-14 | Discharge: 2018-10-14 | Disposition: A | Discharge status: Home / Self Care | Payer: 59 | Source: Ambulatory Visit | Attending: Urology | Admitting: Urology

## 2018-10-14 DIAGNOSIS — Z01818 Encounter for other preprocedural examination: Secondary | ICD-10-CM | POA: Diagnosis not present

## 2018-10-14 DIAGNOSIS — I10 Essential (primary) hypertension: Secondary | ICD-10-CM | POA: Insufficient documentation

## 2018-10-14 DIAGNOSIS — E785 Hyperlipidemia, unspecified: Secondary | ICD-10-CM | POA: Insufficient documentation

## 2018-10-14 DIAGNOSIS — E669 Obesity, unspecified: Secondary | ICD-10-CM | POA: Insufficient documentation

## 2018-10-14 DIAGNOSIS — N200 Calculus of kidney: Secondary | ICD-10-CM | POA: Diagnosis not present

## 2018-10-14 DIAGNOSIS — Z87442 Personal history of urinary calculi: Secondary | ICD-10-CM | POA: Insufficient documentation

## 2018-10-14 DIAGNOSIS — G4730 Sleep apnea, unspecified: Secondary | ICD-10-CM | POA: Insufficient documentation

## 2018-10-14 DIAGNOSIS — K449 Diaphragmatic hernia without obstruction or gangrene: Secondary | ICD-10-CM | POA: Diagnosis not present

## 2018-10-14 DIAGNOSIS — Z6834 Body mass index (BMI) 34.0-34.9, adult: Secondary | ICD-10-CM | POA: Insufficient documentation

## 2018-10-14 HISTORY — PX: EXTRACORPOREAL SHOCK WAVE LITHOTRIPSY: SHX1557

## 2018-10-14 SURGERY — LITHOTRIPSY, ESWL
Anesthesia: LOCAL | Laterality: Left

## 2018-10-14 MED ORDER — DIPHENHYDRAMINE HCL 25 MG PO CAPS
25.0000 mg | ORAL_CAPSULE | ORAL | Status: AC
  Administered 2018-10-14: 25 mg via ORAL
  Filled 2018-10-14: qty 1

## 2018-10-14 MED ORDER — DIAZEPAM 5 MG PO TABS
10.0000 mg | ORAL_TABLET | ORAL | Status: AC
  Administered 2018-10-14: 10 mg via ORAL
  Filled 2018-10-14: qty 2

## 2018-10-14 MED ORDER — SODIUM CHLORIDE 0.9 % IV SOLN
INTRAVENOUS | Status: DC
  Administered 2018-10-14: 07:00:00 via INTRAVENOUS

## 2018-10-14 MED ORDER — CIPROFLOXACIN HCL 500 MG PO TABS
500.0000 mg | ORAL_TABLET | ORAL | Status: AC
  Administered 2018-10-14: 500 mg via ORAL
  Filled 2018-10-14: qty 1

## 2018-10-14 NOTE — Op Note (Signed)
[facility] Operative Note  Treating Physician: Ellison Hughs, MD  Pre-op diagnosis: 5 and 7 mm left renal stones  Post-op diagnosis: Same   Procedure: Left ESWL  See Aris Everts OP note scanned into chart. Also because of the size, density, location and other factors that cannot be anticipated I feel this will likely be a staged procedure. This fact supersedes any indication in the scanned [place] stone operative note to the contrary

## 2018-10-14 NOTE — H&P (Signed)
Urology Preoperative H&P   Chief Complaint: Left renal stones and flank pain  History of Present Illness: Devon Griffith is a 57 y.o. 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   Devon Griffith is here today for a routine f/u KUB.   Last PSA- 0.96 (12/2017)   From a urinary standpoint, he reports a weakened force of stream, but feels like he is still emptying his bladder well. He does state that over the past 12 months he feels like he is going frequently as well as having more urgency to void. Nocturia 4-5. He does report bilateral pedal edema at the end of the day. No prior history of congestive heart failure or cardiac dysfunction. He denies interval urinary tract infections, dysuria or hematuria.   Kidney stone aspect, the patient does report vague left-sided flank/back pain over the past 2 months and denies associated nausea/vomiting, fever/chills or interval stone passage.   KUB from 09/19/18 shows multiple left mid-pole renal stones.   Past Medical History:  Diagnosis Date  . Arrhythmia    wore monitor 2-3 weeks.aprox 5 years ago.  . H/O hiatal hernia    hx of  . Hyperlipidemia   . Hypertension   . Kidney stones   . Knee pain   . Pain in limb   . Sleep apnea    CPAP  settings at 10   . Urinary frequency     Past Surgical History:  Procedure Laterality Date  . HERNIA REPAIR  2000  . KNEE ARTHROSCOPY W/ ACL RECONSTRUCTION  2002   left knee  . LITHOTRIPSY  2002  . VARICOSE VEIN SURGERY  2013   [facility]    Allergies: No Known Allergies  Family History  Problem Relation Age of Onset  . Cancer Mother   . Emphysema Father     Social History:  reports that he has never smoked. He has never used smokeless tobacco. He reports that he drank about 12.0 standard drinks of alcohol per week. He reports that he does not use drugs.  ROS: A complete review of systems was performed.  All systems are negative except for pertinent findings  as noted.  Physical Exam:  Vital signs in last 24 hours: Temp:  [97.9 F (36.6 C)] 97.9 F (36.6 C) (10/28 0629) Pulse Rate:  [62] 62 (10/28 0629) Resp:  [16] 16 (10/28 0629) BP: (117)/(78) 117/78 (10/28 0629) SpO2:  [96 %] 96 % (10/28 0629) Weight:  [112.9 kg] 112.9 kg (10/28 4431) Constitutional:  Alert and oriented, No acute distress Cardiovascular: Regular rate and rhythm, No JVD Respiratory: Normal respiratory effort, Lungs clear bilaterally GI: Abdomen is soft, nontender, nondistended, no abdominal masses GU: No CVA tenderness Lymphatic: No lymphadenopathy Neurologic: Grossly intact, no focal deficits Psychiatric: Normal mood and affect  Laboratory Data:  No results for input(s): WBC, HGB, HCT, PLT in the last 72 hours.  No results for input(s): NA, K, CL, GLUCOSE, BUN, CALCIUM, CREATININE in the last 72 hours.  Invalid input(s): CO3   No results found for this or any previous visit (from the past 24 hour(s)). No results found for this or any previous visit (from the past 240 hour(s)).  Renal Function: No results for input(s): CREATININE in the last 168 hours. Estimated Creatinine Clearance: 88.3 mL/min (by C-G formula based on SCr of 1.15 mg/dL).  Radiologic Imaging: No results found.  I independently reviewed the above imaging studies.  Assessment and Plan Devon Hayward  RONTRELL Griffith is a 57 y.o. male with multiple left mid-pole renal stones and intermittent left flank pain  .The risks, benefits and alternatives of LEFT ESWL was discussed with the patient. I described the risks which include arrhythmia, kidney contusion, kidney hemorrhage, need for transfusion, back discomfort, flank ecchymosis, flank abrasion, inability to fracture the stone, inability to pass stone fragments, Steinstrasse, infection associated with obstructing stones, need for an alternative surgical procedure and possible need for repeat shockwave lithotripsy.  The patient voices understanding and wishes to  proceed.       Ellison Hughs, MD 10/14/2018, 6:44 AM  [facility] Pager: [phone]

## 2018-10-15 ENCOUNTER — Encounter (HOSPITAL_COMMUNITY): Payer: Self-pay | Admitting: Urology

## 2018-11-26 ENCOUNTER — Other Ambulatory Visit: Payer: Self-pay | Admitting: Cardiovascular Disease

## 2018-11-26 DIAGNOSIS — G4733 Obstructive sleep apnea (adult) (pediatric): Secondary | ICD-10-CM | POA: Diagnosis not present

## 2018-12-27 DIAGNOSIS — G4733 Obstructive sleep apnea (adult) (pediatric): Secondary | ICD-10-CM | POA: Diagnosis not present

## 2019-01-06 DIAGNOSIS — M66862 Spontaneous rupture of other tendons, left lower leg: Secondary | ICD-10-CM | POA: Diagnosis not present

## 2019-01-06 DIAGNOSIS — M25572 Pain in left ankle and joints of left foot: Secondary | ICD-10-CM | POA: Diagnosis not present

## 2019-01-06 DIAGNOSIS — M79672 Pain in left foot: Secondary | ICD-10-CM | POA: Diagnosis not present

## 2019-01-14 DIAGNOSIS — M25572 Pain in left ankle and joints of left foot: Secondary | ICD-10-CM | POA: Diagnosis not present

## 2019-01-17 DIAGNOSIS — Z23 Encounter for immunization: Secondary | ICD-10-CM | POA: Diagnosis not present

## 2019-01-17 DIAGNOSIS — I10 Essential (primary) hypertension: Secondary | ICD-10-CM | POA: Diagnosis not present

## 2019-01-17 DIAGNOSIS — G4733 Obstructive sleep apnea (adult) (pediatric): Secondary | ICD-10-CM | POA: Diagnosis not present

## 2019-01-17 DIAGNOSIS — E7800 Pure hypercholesterolemia, unspecified: Secondary | ICD-10-CM | POA: Diagnosis not present

## 2019-01-17 DIAGNOSIS — Z0000 Encounter for general adult medical examination without abnormal findings: Secondary | ICD-10-CM | POA: Diagnosis not present

## 2019-01-27 DIAGNOSIS — S86012D Strain of left Achilles tendon, subsequent encounter: Secondary | ICD-10-CM | POA: Diagnosis not present

## 2019-03-23 IMAGING — CT CT ANGIO CHEST
2 of 8 series · 18 of 36 positions shown · IV contrast (iopamidol)
Comparison: None.

CLINICAL DATA: Shortness of breath for 2-3 days.

EXAM:
CT ANGIOGRAPHY CHEST WITH CONTRAST
TECHNIQUE: Multidetector CT imaging of the chest was performed using the
standard protocol during bolus administration of intravenous
contrast. Multiplanar CT image reconstructions and MIPs were
obtained to evaluate the vascular anatomy.
CONTRAST:  75mL [id number] IOPAMIDOL ([id number]) INJECTION 76%

[Series 6: thins · axial · 0.82mm/px · z∈[+1088,+1378]mm · 17 of 323 slices shown]
[im 17/323  lung]
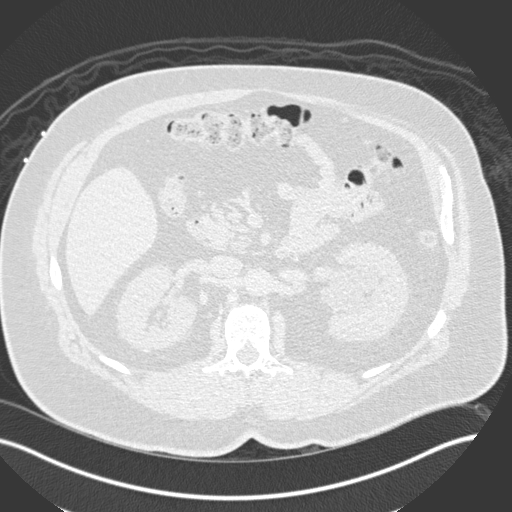
[im 34/323  mediastinal]
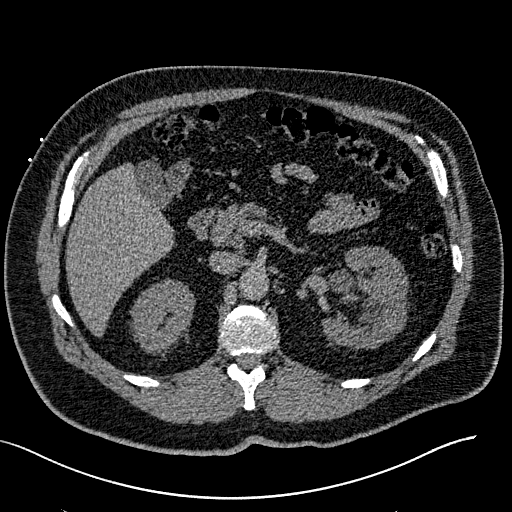
[im 51/323  lung]
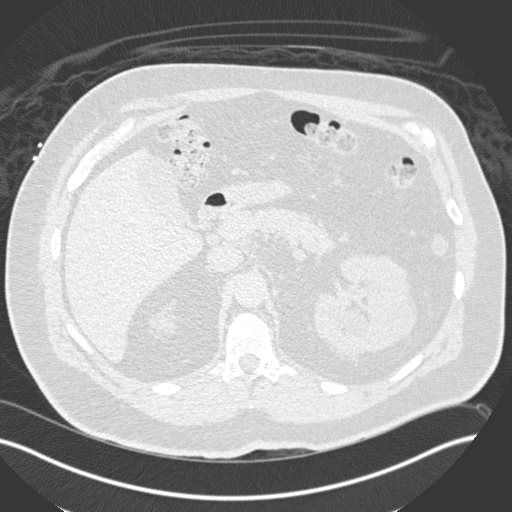
[im 68/323  mediastinal]
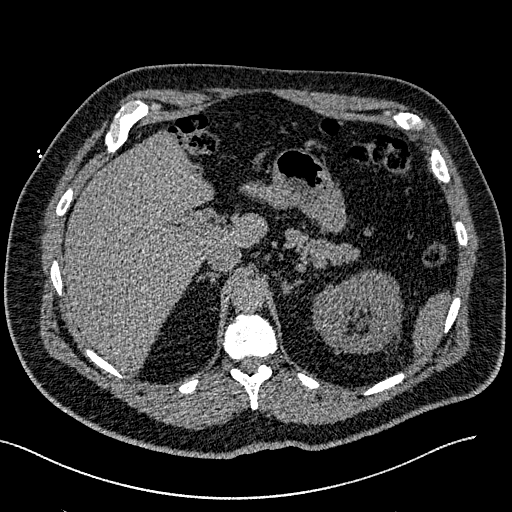
[im 85/323  lung]
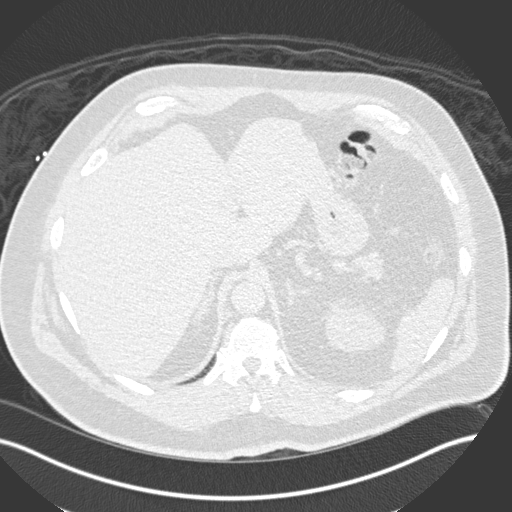
[im 102/323  mediastinal]
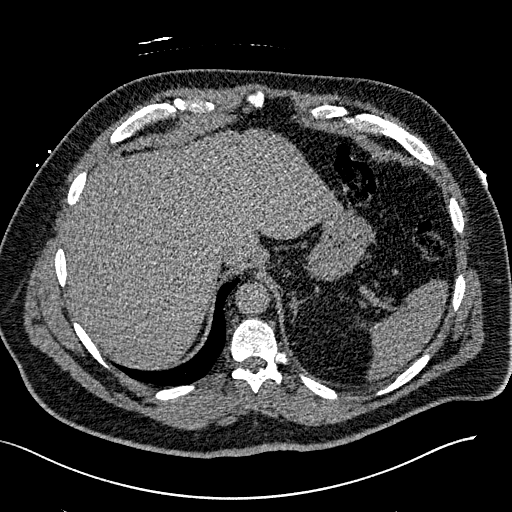
[im 119/323  lung]
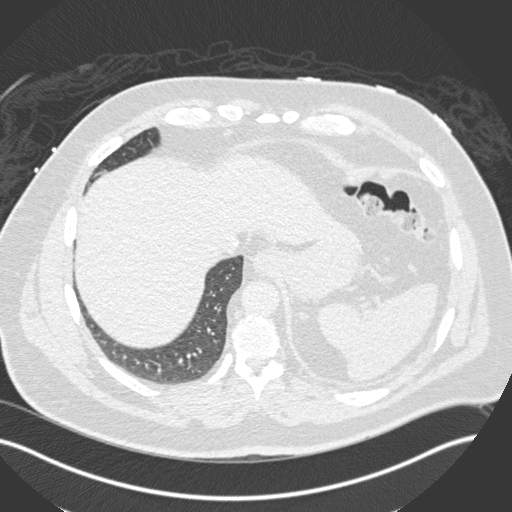
[im 136/323  mediastinal]
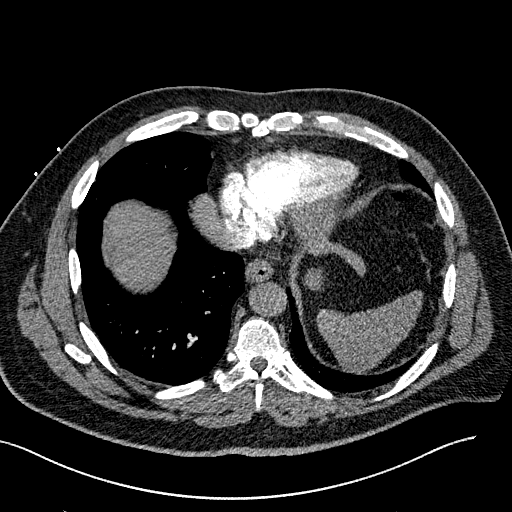
[im 170/323  lung]
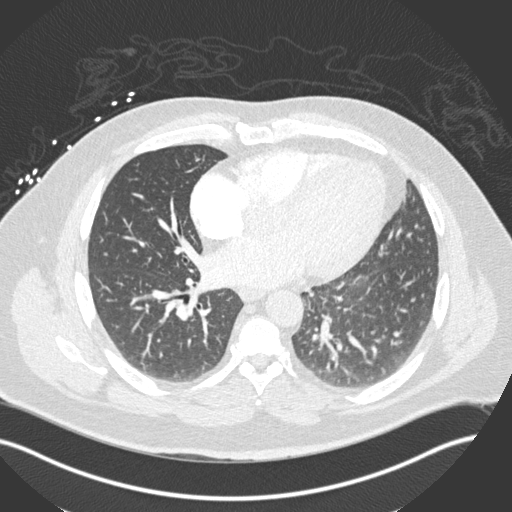
[im 187/323  mediastinal]
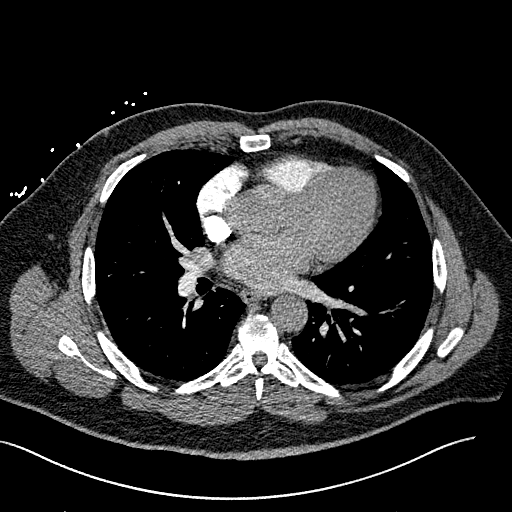
[im 204/323  lung]
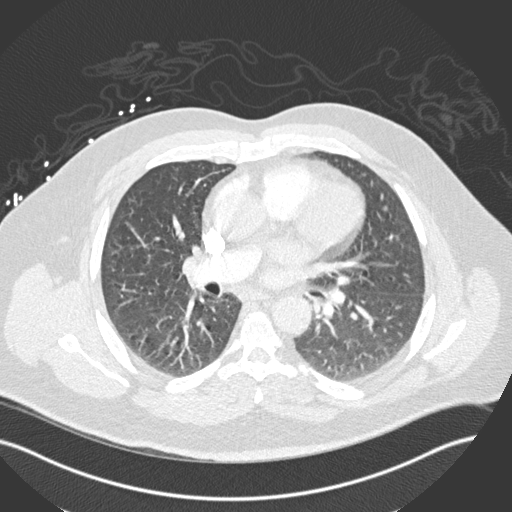
[im 221/323  mediastinal]
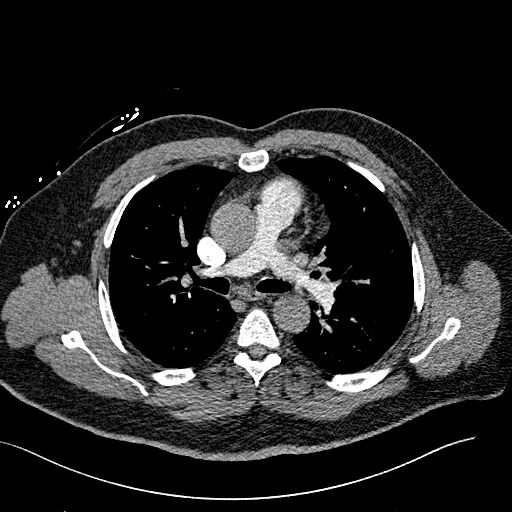
[im 238/323  lung]
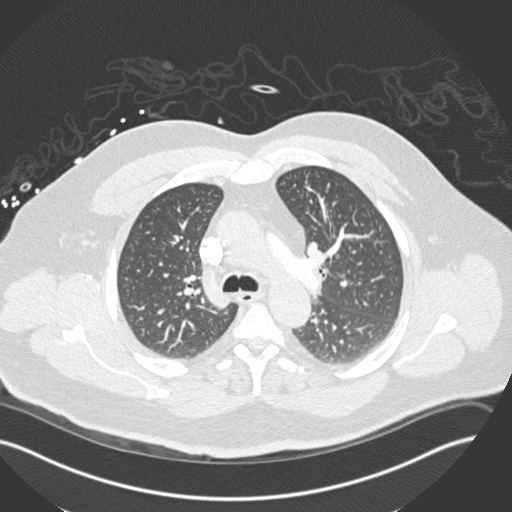
[im 255/323  mediastinal]
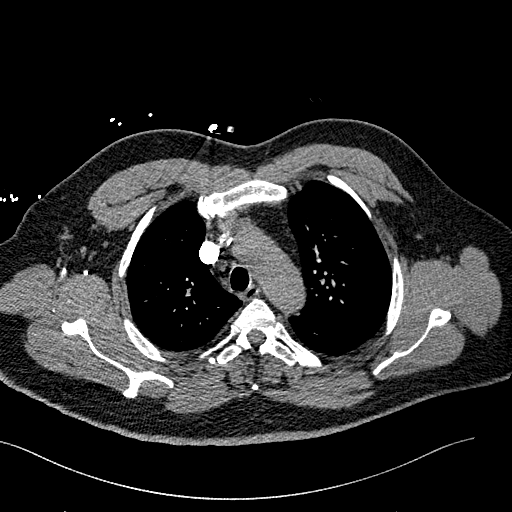
[im 272/323  lung]
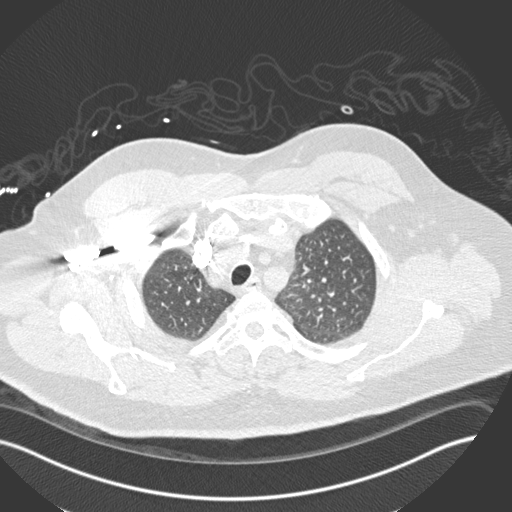
[im 289/323  mediastinal]
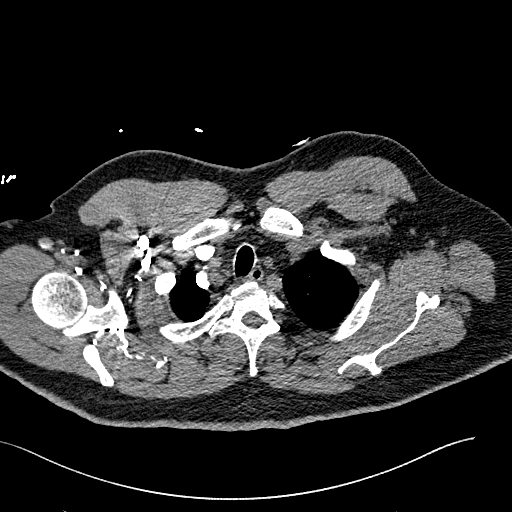
[im 306/323  lung]
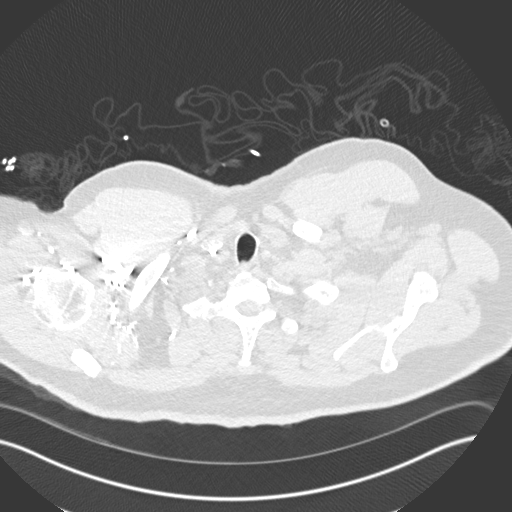

[Series 8: coronal mpr · coronal · 0.64mm/px · 1 of 151 slices shown]
[im 76/151  mediastinal]
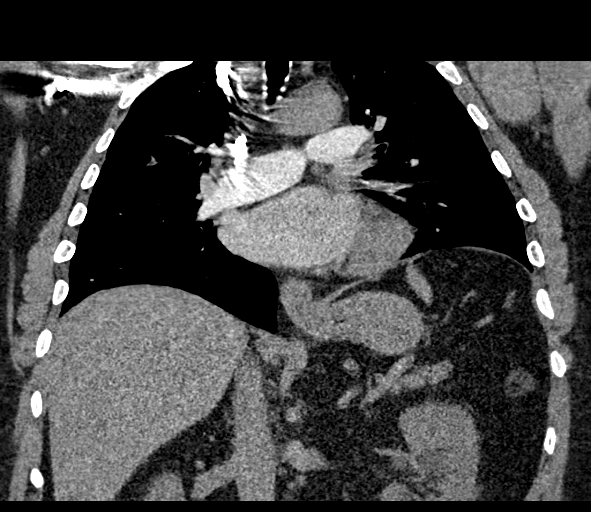

[18 of 36 positions shown; findings below may reference images not displayed]

FINDINGS: Cardiovascular:

--Pulmonary arteries: Contrast injection is sufficient to
demonstrate satisfactory opacification of the pulmonary arteries to
the segmental level. There is no pulmonary embolus. The main
pulmonary artery is within normal limits for size.

--Aorta: Limited opacification of the aorta due to bolus timing
optimization for the pulmonary arteries. Conventional 3 vessel
aortic branching pattern. The aortic course and caliber are normal.
There is no aortic atherosclerosis.

--Heart: Normal size. No pericardial effusion.

Mediastinum/Nodes: No mediastinal, hilar or axillary
lymphadenopathy. The visualized thyroid and thoracic esophageal
course are unremarkable.

Lungs/Pleura: 5 mm subpleural right middle lobe nodule. No pleural
effusion or pneumothorax. No focal airspace consolidation. No focal
pleural abnormality.

Upper Abdomen: Contrast bolus timing is not optimized for evaluation
of the abdominal organs. Within this limitation, the visualized
organs of the upper abdomen are normal.

Musculoskeletal: No chest wall abnormality. No acute or significant
osseous findings.

Review of the MIP images confirms the above findings.
IMPRESSION: 1. No pulmonary embolus or other acute thoracic abnormality.
2. 5 mm subpleural right middle lobe nodule. No follow-up needed if
patient is low-risk. Non-contrast chest CT can be considered in 12
months if patient is high-risk. This recommendation follows the
consensus statement: Guidelines for Management of Incidental
Pulmonary Nodules Detected on CT Images: From the [HOSPITAL]

## 2019-03-23 IMAGING — CR DG CHEST 2V
2 series · 2 of 2 positions shown · non-contrast
Comparison: 08/05/2009

CLINICAL DATA: Dyspnea for the past 3 days

EXAM:
CHEST - 2 VIEW

[chest pa]
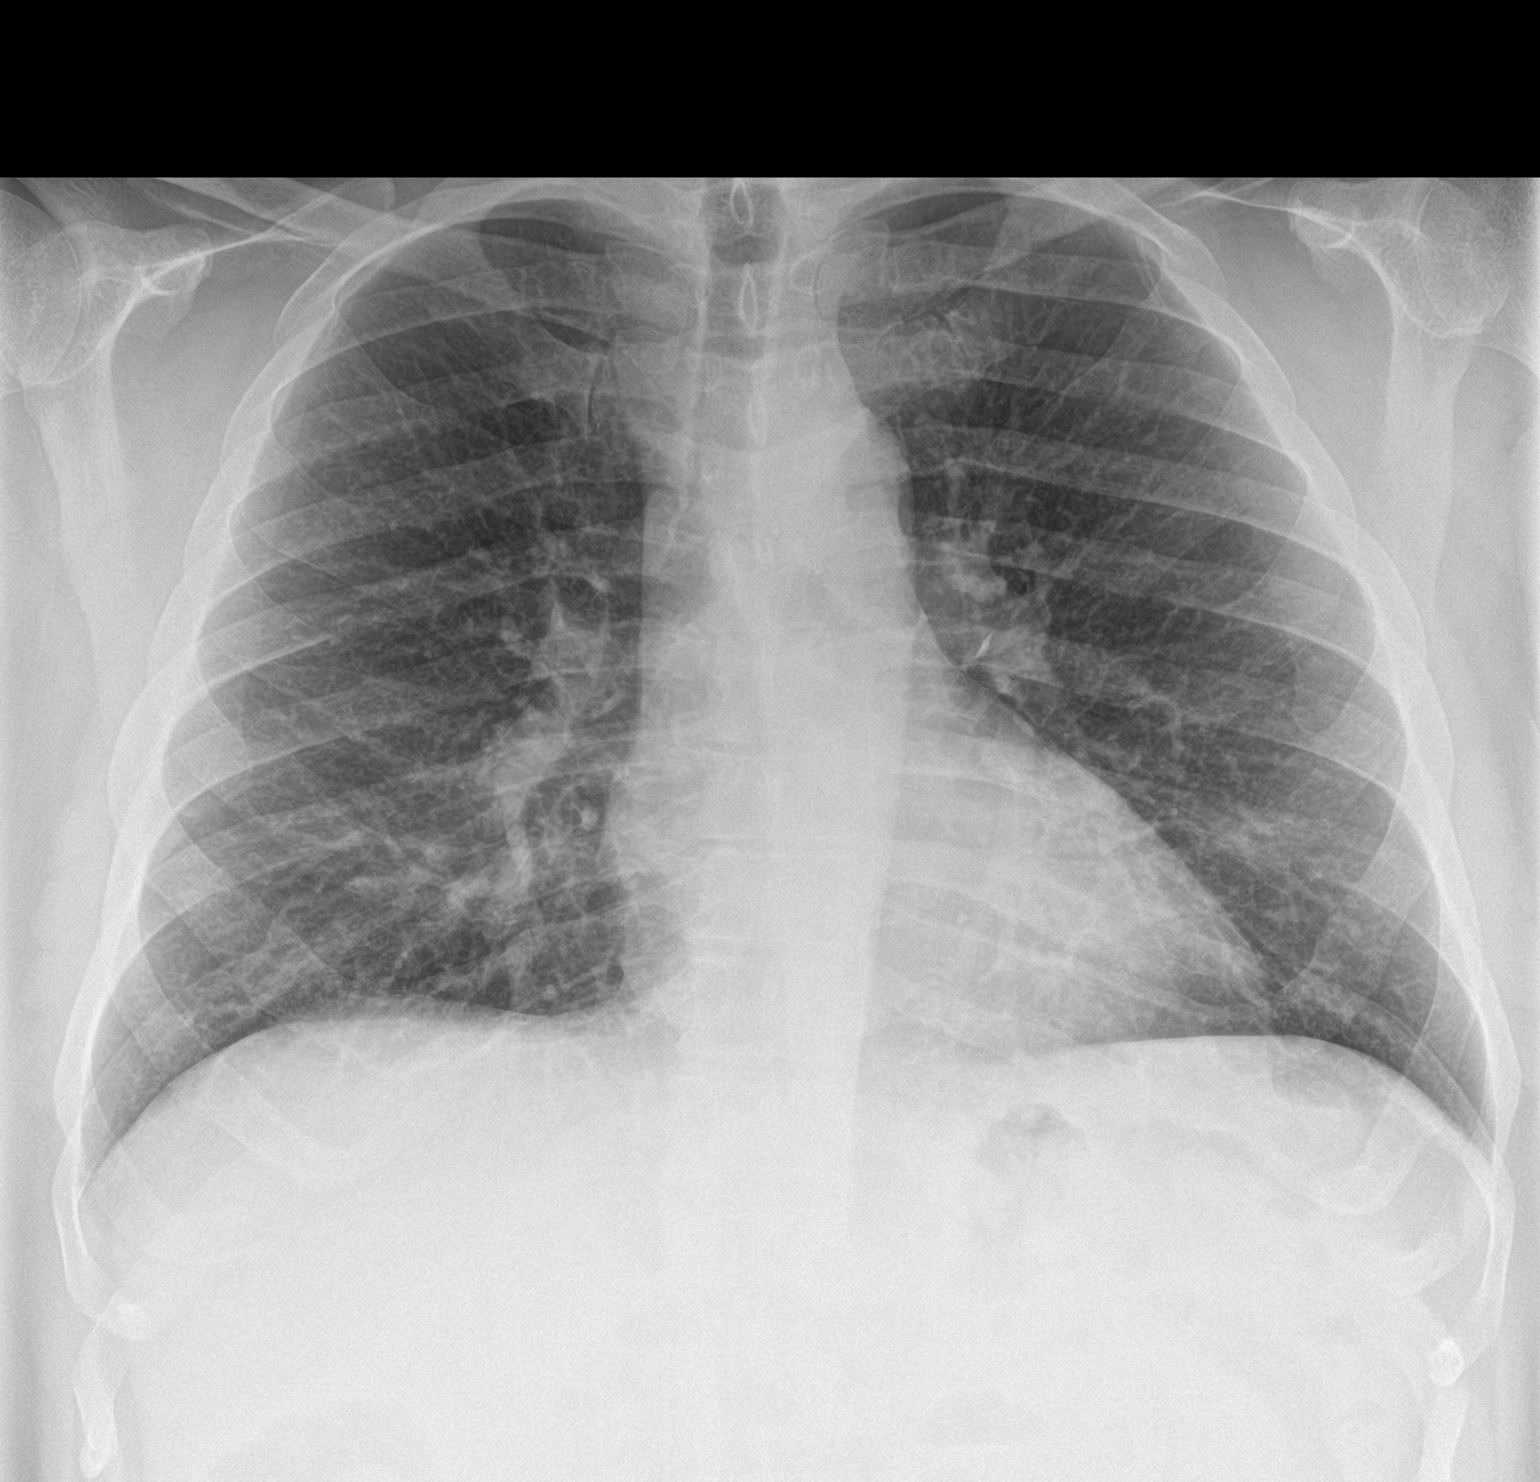

[chest lat]
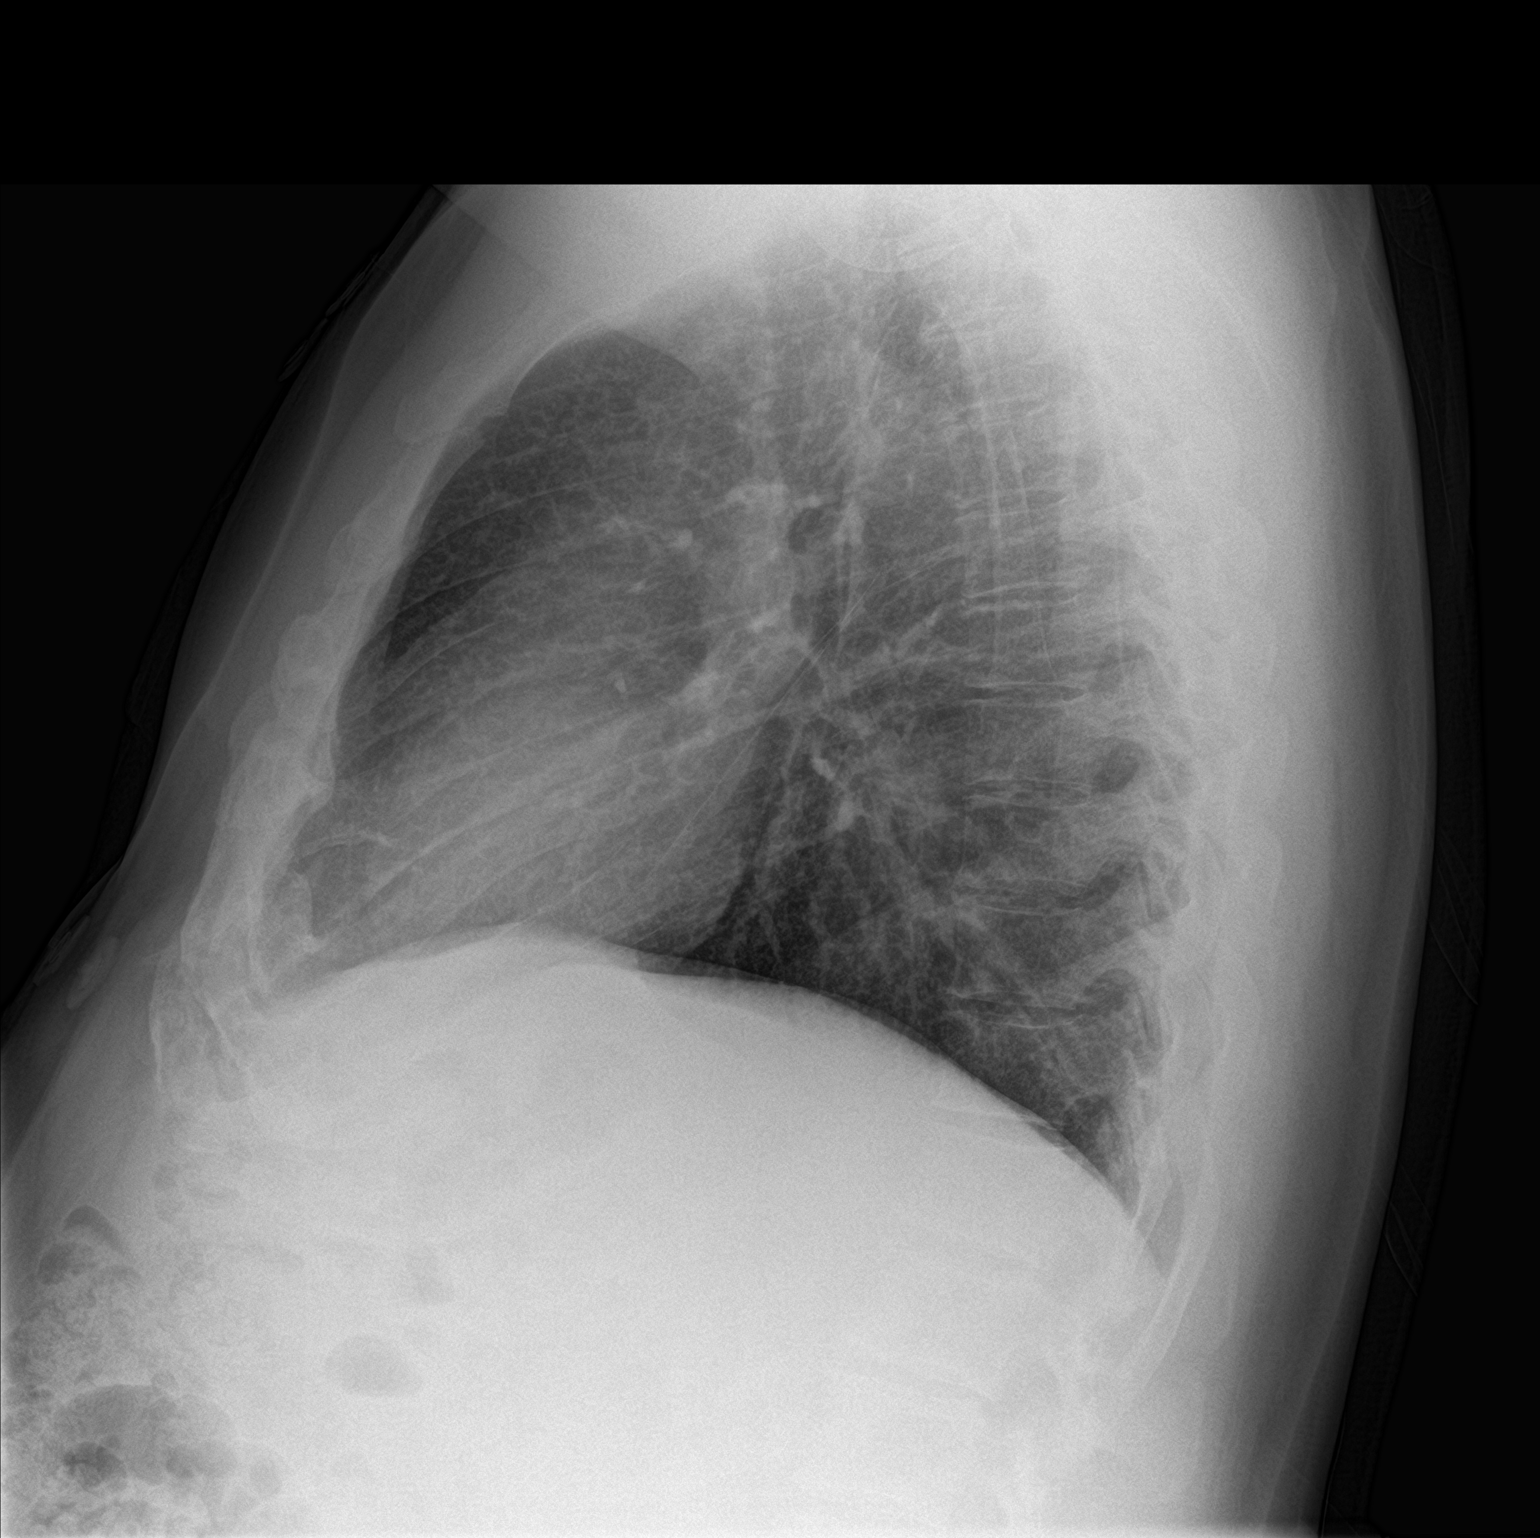

[2 of 2 positions shown; findings below may reference images not displayed]

FINDINGS: The heart size and mediastinal contours are within normal limits.
Both lungs are clear. The visualized skeletal structures are
unremarkable.
IMPRESSION: No active cardiopulmonary disease.

## 2019-03-24 DIAGNOSIS — J069 Acute upper respiratory infection, unspecified: Secondary | ICD-10-CM | POA: Diagnosis not present

## 2019-04-12 IMAGING — DX DG ABDOMEN 1V
2 series · 2 of 2 positions shown · non-contrast
Comparison: 09/19/2018; CT abdomen pelvis-03/29/2012; chest
CT-09/24/2018

CLINICAL DATA: Preoperative examination left-sided renal stone

EXAM:
ABDOMEN - 1 VIEW

[abdomen kub (1 of 2)]
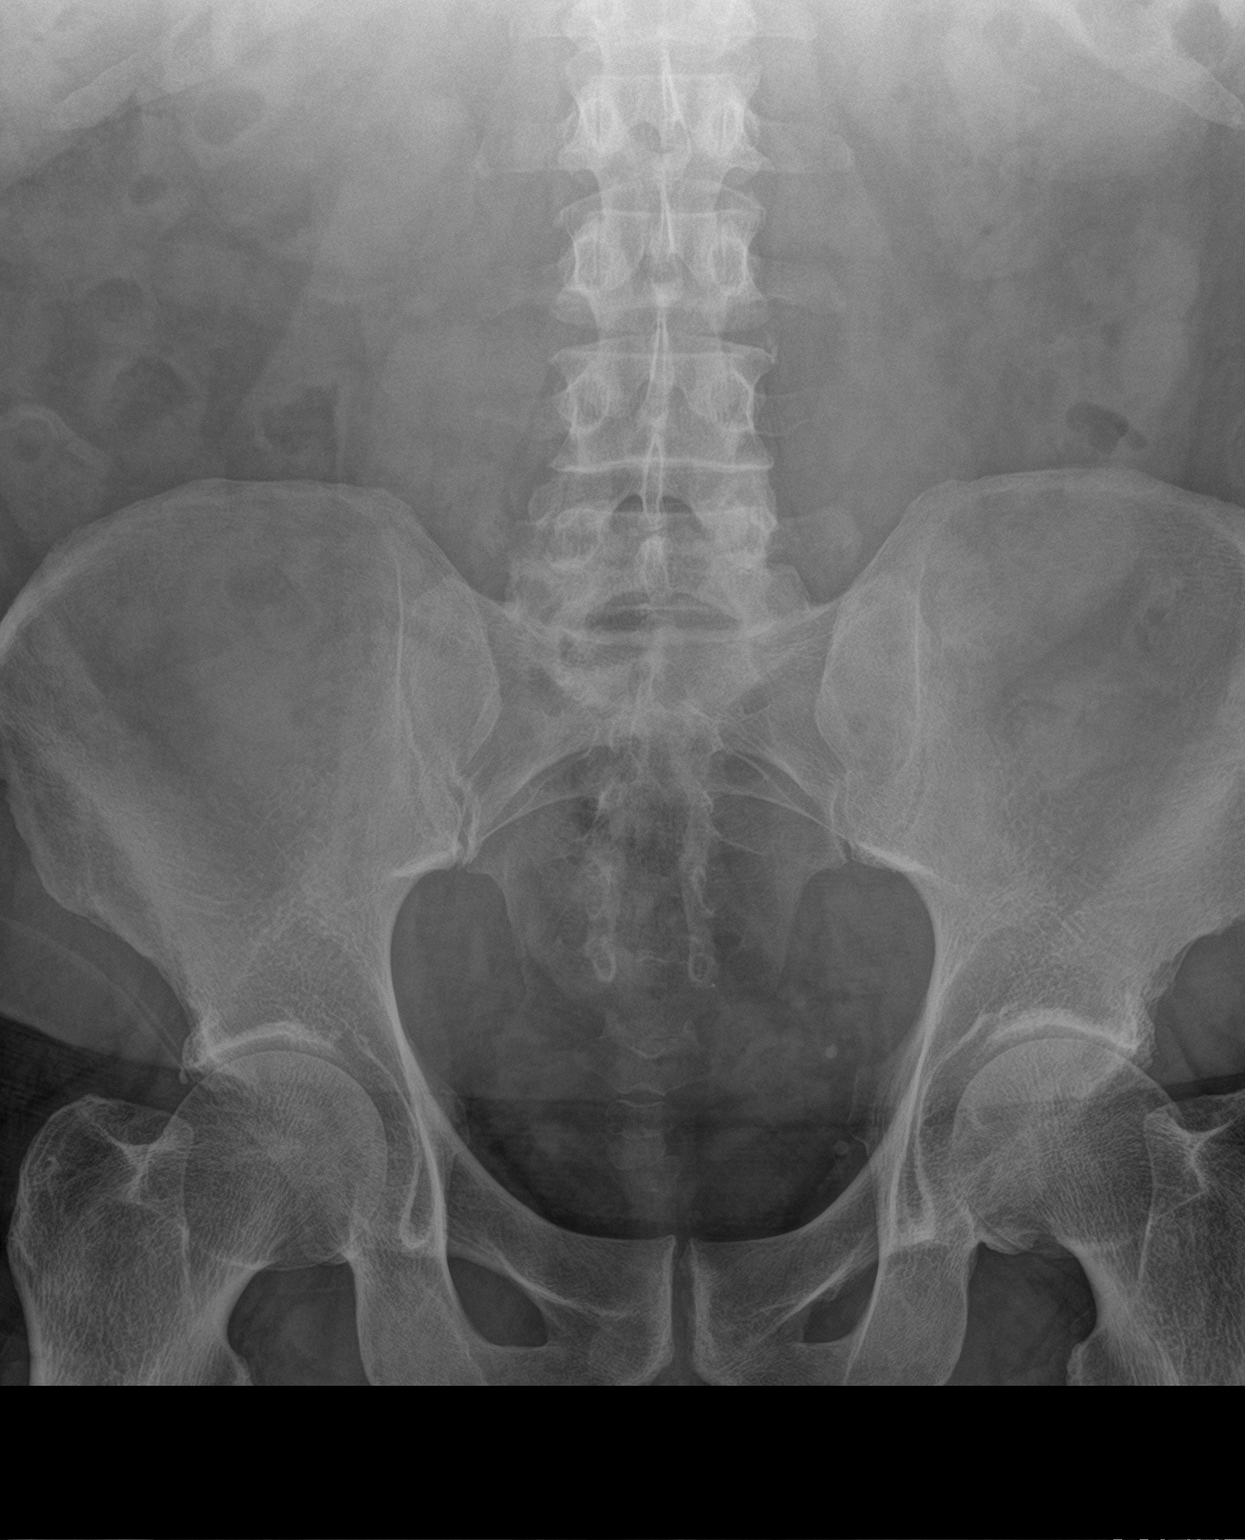

[abdomen kub (2 of 2)]
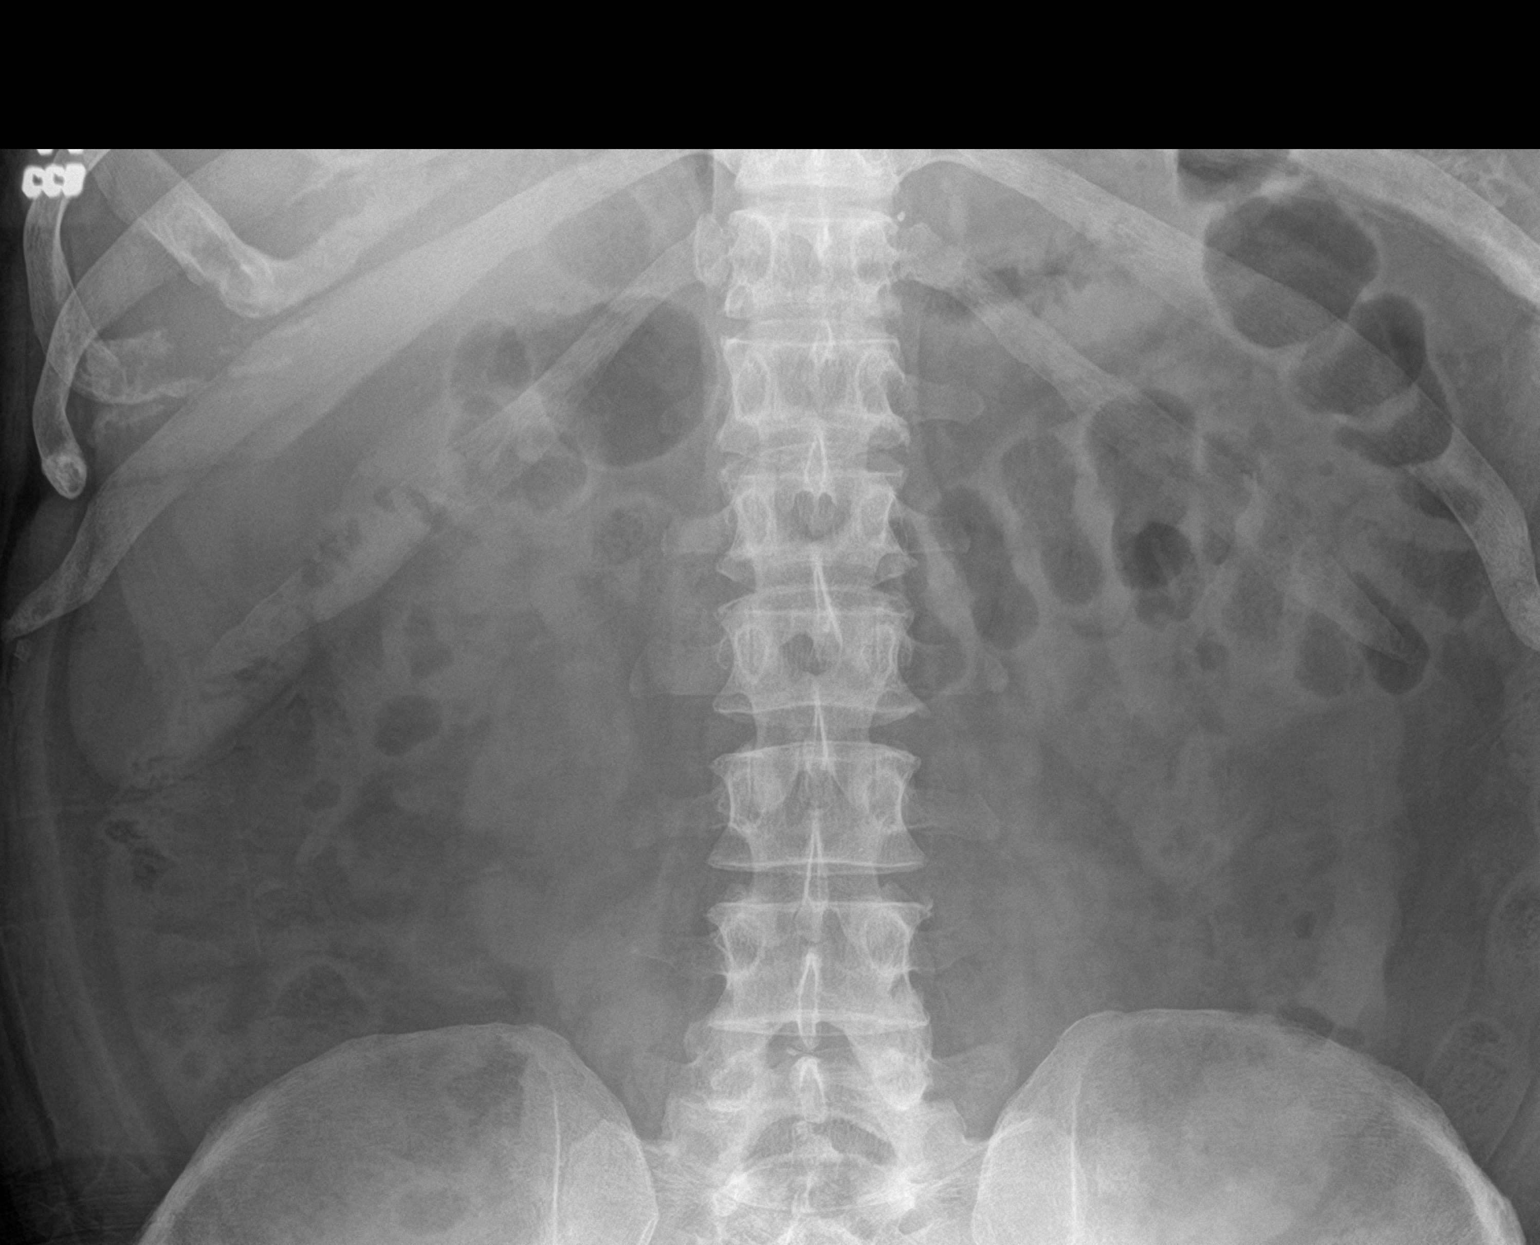

[2 of 2 positions shown; findings below may reference images not displayed]

FINDINGS: No definitive abnormal opacities overlies expected location of
either renal fossa, ureter or the urinary bladder, though note,
evaluation degraded secondary overlying colonic stool burden.

Punctate phleboliths overlie the left hemipelvis.

Nonobstructive bowel gas pattern.

No acute osseus abnormalities.
IMPRESSION: No definite evidence of nephrolithiasis, though note, examination
degraded secondary overlying colonic stool burden. Further
evaluation could be performed with noncontrast abdominal CT as
indicated.

## 2019-04-25 ENCOUNTER — Other Ambulatory Visit: Payer: Self-pay | Admitting: Cardiovascular Disease

## 2019-04-25 NOTE — Telephone Encounter (Signed)
Rosuvastatin 20 mg refilled  

## 2019-05-26 ENCOUNTER — Encounter (HOSPITAL_COMMUNITY): Payer: Self-pay | Admitting: Anesthesiology

## 2019-05-26 ENCOUNTER — Other Ambulatory Visit: Payer: Self-pay

## 2019-05-26 ENCOUNTER — Other Ambulatory Visit (HOSPITAL_COMMUNITY): Payer: Self-pay | Admitting: Orthopedic Surgery

## 2019-05-26 NOTE — Anesthesia Preprocedure Evaluation (Addendum)
Anesthesia Evaluation  Patient identified by MRN, date of birth, ID band Patient awake    Reviewed: Allergy & Precautions, NPO status , Patient's Chart, lab work & pertinent test results  Airway Mallampati: II       Dental no notable dental hx. (+) Teeth Intact   Pulmonary    Pulmonary exam normal breath sounds clear to auscultation       Cardiovascular hypertension, Pt. on medications Normal cardiovascular exam Rhythm:Regular Rate:Normal     Neuro/Psych negative neurological ROS  negative psych ROS   GI/Hepatic   Endo/Other    Renal/GU   negative genitourinary   Musculoskeletal negative musculoskeletal ROS (+)   Abdominal (+) + obese,   Peds  Hematology negative hematology ROS (+)   Anesthesia Other Findings Jacinta Shoe, PA-C Physician Assistant Anesthesiology Progress Notes Signed Date of Service:  05/26/2019 4:29 PM     Signed        Show:Clear all ManualTemplateCopied  Added by: Jacinta Shoe, PA-C  Hover for details Anesthesia Chart Review: SAME DAY WORK-UP    Case:  [id number] Date/Time:  05/27/19 0715  Procedure:  Left Achilles tendon reconstruction; possible FHL transfer, gastroc recession (Left ) - 140min  Anesthesia type:  General  Pre-op diagnosis:  traumatic rupture left Achilles tendon  Location:  [facility] OR ROOM 04 / [place] [facility]  Surgeon:  Wylene Simmer, MD    DISCUSSION: Patient is a 58 year old male scheduled for the above procedure.  History includes never smoker, hypertension, hyperlipidemia, OSA, hiatal hernia, arrhythmia/palpitations (work-up by Dr. Leonia Reeves in 2007), nephrolithiasis, varicose vein ablation.  Last ASA 05/18/19. Patient is a same day work-up, but denied SOB, cough, fever, chest pain in PAT RN phone interview. He will get labs and anesthesiologist evaluation on arrival.   PROVIDERS: Hulan Fess, MD is PCP Shelva Majestic, MD is cardiologist.  Last seen 08/22/18 for HTN and OSA follow-up.    LABS: He will need labs on arrival. Labs as of 09/24/18 showed Cr 1.15 and normal CBC.    IMAGES: CTA chest 09/24/18 (during ED visit for SOB, elevated d-dimer): IMPRESSION: 1. No pulmonary embolus or other acute thoracic abnormality. 2. 5 mm subpleural right middle lobe nodule. No follow-up needed if patient is low-risk. Non-contrast chest CT can be considered in 12 months if patient is high-risk. This recommendation follows the consensus statement: Guidelines for Management of Incidental Pulmonary Nodules Detected on CT Images: From the Fleischner Society 2017; Radiology 2017; 284:228-243. - ED provider Benedetto Goad, PA-C advised patient to follow-up with PCP regarding RML nodule   EKG: 09/24/18: NSR   CV: Nuclear stress test 06/10/13:  Overall Impression: Normal stress nuclear study. LV Wall Motion: NL LV Function; NL Wall Motion; LVEF 61%.  Echo 06/10/13: According to notation by Shelva Majestic, MD, "nuc nl; nl lv fxn on echo with mild Gr I diastolic dysfunction"    Past Medical History: Diagnosis Date . Arrhythmia   wore monitor 2-3 weeks.aprox 5 years ago. . H/O hiatal hernia   hx of . Hyperlipidemia  . Hypertension  . Kidney stones  . Knee pain  . Pain in limb  . Sleep apnea   CPAP  settings at 10  . Urinary frequency     Past Surgical History: Procedure Laterality Date . EXTRACORPOREAL SHOCK WAVE LITHOTRIPSY Left 10/14/2018  Procedure: LEFT EXTRACORPOREAL SHOCK WAVE LITHOTRIPSY (ESWL);  Surgeon: Ceasar Mons, MD;  Location: [facility] ORS;  Service: Urology;  Laterality: Left; . HERNIA REPAIR  2000 .  KNEE ARTHROSCOPY W/ ACL RECONSTRUCTION  2002  left knee . LITHOTRIPSY  2002 . [facility]ICOSE VEIN SURGERY  [facility]   MEDICATIONS:  No current facility-administered medications for this encounter.    . diltiazem (CARDIZEM CD) 240 MG 24 hr  capsule . rosuvastatin (CRESTOR) 20 MG tablet . tamsulosin (FLOMAX) 0.4 MG CAPS capsule . aspirin EC 81 MG tablet . buPROPion (WELLBUTRIN XL) 150 MG 24 hr tablet   Myra Gianotti, PA-C Surgical Short Stay/Anesthesiology [facility] Phone [phone] [facility] Phone [phone] 05/26/2019 4:44 PM           Electronically signed by Jacinta Shoe, PA-C at 05/26/2019 4:44 PM    Pre-admit on 05/27/2019     Detailed Report     Reproductive/Obstetrics                           Anesthesia Physical Anesthesia Plan  ASA: II  Anesthesia Plan: General   Post-op Pain Management:  Regional for Post-op pain   Induction: Intravenous  PONV Risk Score and Plan: 2 and Ondansetron, Dexamethasone and Midazolam  Airway Management Planned: Oral ETT  Additional Equipment:   Intra-op Plan:   Post-operative Plan: Extubation in OR  Informed Consent: I have reviewed the patients History and Physical, chart, labs and discussed the procedure including the risks, benefits and alternatives for the proposed anesthesia with the patient or authorized representative who has indicated his/her understanding and acceptance.     Dental advisory given  Plan Discussed with:   Anesthesia Plan Comments: (PAT note written 05/26/2019 by Myra Gianotti, PA-C. )      Anesthesia Quick Evaluation

## 2019-05-26 NOTE — Progress Notes (Signed)
Anesthesia Chart Review: Kathleene Hazel   Case:  [id number] Date/Time:  05/27/19 0715   Procedure:  Left Achilles tendon reconstruction; possible FHL transfer, gastroc recession (Left ) - 13min   Anesthesia type:  General   Pre-op diagnosis:  traumatic rupture left Achilles tendon   Location:  [facility] OR ROOM 04 / [place] OR   Surgeon:  Wylene Simmer, MD      DISCUSSION: Patient is a 58 year old male scheduled for the above procedure.  History includes never smoker, hypertension, hyperlipidemia, OSA, hiatal hernia, arrhythmia/palpitations (work-up by Dr. Leonia Reeves in 2007), nephrolithiasis, varicose vein ablation.  Last ASA 05/18/19. Patient is a same day work-up, but denied SOB, cough, fever, chest pain in PAT RN phone interview. He will get labs and anesthesiologist evaluation on arrival.   PROVIDERS: Hulan Fess, MD is PCP Shelva Majestic, MD is cardiologist. Last seen 08/22/18 for HTN and OSA follow-up.    LABS: He will need labs on arrival. Labs as of 09/24/18 showed Cr 1.15 and normal CBC.    IMAGES: CTA chest 09/24/18 (during ED visit for SOB, elevated d-dimer): IMPRESSION: 1. No pulmonary embolus or other acute thoracic abnormality. 2. 5 mm subpleural right middle lobe nodule. No follow-up needed if patient is low-risk. Non-contrast chest CT can be considered in 12 months if patient is high-risk. This recommendation follows the consensus statement: Guidelines for Management of Incidental Pulmonary Nodules Detected on CT Images: From the Fleischner Society 2017; Radiology 2017; 284:228-243. - ED provider Benedetto Goad, PA-C advised patient to follow-up with PCP regarding RML nodule   EKG: 09/24/18: NSR   CV: Nuclear stress test 06/10/13:  Overall Impression:  Normal stress nuclear study. LV Wall Motion:  NL LV Function; NL Wall Motion; LVEF 61%.  Echo 06/10/13: According to notation by Shelva Majestic, MD, "nuc nl; nl lv fxn on echo with mild Gr I diastolic dysfunction"   Past Medical  History:  Diagnosis Date  . Arrhythmia    wore monitor 2-3 weeks.aprox 5 years ago.  . H/O hiatal hernia    hx of  . Hyperlipidemia   . Hypertension   . Kidney stones   . Knee pain   . Pain in limb   . Sleep apnea    CPAP  settings at 10   . Urinary frequency     Past Surgical History:  Procedure Laterality Date  . EXTRACORPOREAL SHOCK WAVE LITHOTRIPSY Left 10/14/2018   Procedure: LEFT EXTRACORPOREAL SHOCK WAVE LITHOTRIPSY (ESWL);  Surgeon: Ceasar Mons, MD;  Location: [facility];  Service: Urology;  Laterality: Left;  . HERNIA REPAIR  2000  . KNEE ARTHROSCOPY W/ ACL RECONSTRUCTION  2002   left knee  . LITHOTRIPSY  2002  . VARICOSE VEIN SURGERY  2013   [facility]    MEDICATIONS: No current facility-administered medications for this encounter.    . diltiazem (CARDIZEM CD) 240 MG 24 hr capsule  . rosuvastatin (CRESTOR) 20 MG tablet  . tamsulosin (FLOMAX) 0.4 MG CAPS capsule  . aspirin EC 81 MG tablet  . buPROPion (WELLBUTRIN XL) 150 MG 24 hr tablet    Myra Gianotti, PA-C Surgical Short Stay/Anesthesiology [facility] Phone [phone] [facility] Phone [phone] 05/26/2019 4:44 PM

## 2019-05-26 NOTE — Progress Notes (Signed)
PCP - Dr. Hulan Fess Chest x-ray - denies EKG - 10/05/18 ECHO - 06/10/13  Sleep Study -  CPAP -   Blood Thinner Instructions: N/A Aspirin Instructions: LD 05/18/2019  Anesthesia review: YES  Patient verbalized understanding of instructions that were given to them during pre-op phone call. Patient denies SOB, fever, cough, and chest pain. Patient verbalized understanding of medications instructions for day of surgery. Patient to take dilitazem, rosuvastatin, and tamsulosin.

## 2019-05-27 ENCOUNTER — Ambulatory Visit (HOSPITAL_COMMUNITY): Payer: Commercial Managed Care - PPO | Admitting: Vascular Surgery

## 2019-05-27 ENCOUNTER — Ambulatory Visit (HOSPITAL_COMMUNITY)
Admission: RE | Admit: 2019-05-27 | Discharge: 2019-05-27 | Disposition: A | Discharge status: Home / Self Care | Payer: Commercial Managed Care - PPO | Attending: Orthopedic Surgery | Admitting: Orthopedic Surgery

## 2019-05-27 ENCOUNTER — Encounter (HOSPITAL_COMMUNITY): Payer: Self-pay | Admitting: Registered Nurse

## 2019-05-27 ENCOUNTER — Encounter (HOSPITAL_COMMUNITY)
Admission: RE | Disposition: A | Discharge status: Home / Self Care | Payer: Self-pay | Source: Home / Self Care | Attending: Orthopedic Surgery

## 2019-05-27 DIAGNOSIS — Z809 Family history of malignant neoplasm, unspecified: Secondary | ICD-10-CM | POA: Insufficient documentation

## 2019-05-27 DIAGNOSIS — Y939 Activity, unspecified: Secondary | ICD-10-CM | POA: Insufficient documentation

## 2019-05-27 DIAGNOSIS — Z1159 Encounter for screening for other viral diseases: Secondary | ICD-10-CM | POA: Diagnosis not present

## 2019-05-27 DIAGNOSIS — Z79899 Other long term (current) drug therapy: Secondary | ICD-10-CM | POA: Insufficient documentation

## 2019-05-27 DIAGNOSIS — Z87442 Personal history of urinary calculi: Secondary | ICD-10-CM | POA: Diagnosis not present

## 2019-05-27 DIAGNOSIS — Z6836 Body mass index (BMI) 36.0-36.9, adult: Secondary | ICD-10-CM | POA: Insufficient documentation

## 2019-05-27 DIAGNOSIS — X58XXXA Exposure to other specified factors, initial encounter: Secondary | ICD-10-CM | POA: Diagnosis not present

## 2019-05-27 DIAGNOSIS — G4730 Sleep apnea, unspecified: Secondary | ICD-10-CM | POA: Insufficient documentation

## 2019-05-27 DIAGNOSIS — E669 Obesity, unspecified: Secondary | ICD-10-CM | POA: Insufficient documentation

## 2019-05-27 DIAGNOSIS — K449 Diaphragmatic hernia without obstruction or gangrene: Secondary | ICD-10-CM | POA: Insufficient documentation

## 2019-05-27 DIAGNOSIS — Z7982 Long term (current) use of aspirin: Secondary | ICD-10-CM | POA: Insufficient documentation

## 2019-05-27 DIAGNOSIS — I499 Cardiac arrhythmia, unspecified: Secondary | ICD-10-CM | POA: Insufficient documentation

## 2019-05-27 DIAGNOSIS — G8929 Other chronic pain: Secondary | ICD-10-CM | POA: Insufficient documentation

## 2019-05-27 DIAGNOSIS — Z825 Family history of asthma and other chronic lower respiratory diseases: Secondary | ICD-10-CM | POA: Insufficient documentation

## 2019-05-27 DIAGNOSIS — S86012A Strain of left Achilles tendon, initial encounter: Secondary | ICD-10-CM | POA: Insufficient documentation

## 2019-05-27 DIAGNOSIS — I10 Essential (primary) hypertension: Secondary | ICD-10-CM | POA: Insufficient documentation

## 2019-05-27 DIAGNOSIS — E785 Hyperlipidemia, unspecified: Secondary | ICD-10-CM | POA: Diagnosis not present

## 2019-05-27 HISTORY — PX: ACHILLES TENDON SURGERY: SHX542

## 2019-05-27 HISTORY — DX: Unspecified disorder of synovium and tendon, unspecified ankle and foot: M67.979

## 2019-05-27 LAB — BASIC METABOLIC PANEL
Anion gap: 11 (ref 5–15)
BUN: 27 mg/dL — ABNORMAL HIGH (ref 6–20)
CO2: 19 mmol/L — ABNORMAL LOW (ref 22–32)
Calcium: 8.9 mg/dL (ref 8.9–10.3)
Chloride: 108 mmol/L (ref 98–111)
Creatinine, Ser: 1.1 mg/dL (ref 0.61–1.24)
GFR calc Af Amer: >60 mL/min (ref >60)
GFR calc non Af Amer: >60 mL/min (ref >60)
Glucose, Bld: 105 mg/dL — ABNORMAL HIGH (ref 70–99)
Potassium: 4.5 mmol/L (ref 3.5–5.1)
Sodium: 138 mmol/L (ref 135–145)

## 2019-05-27 LAB — CBC
HCT: 43.5 % (ref 39.0–52.0)
Hemoglobin: 15 g/dL (ref 13.0–17.0)
MCH: 30.5 pg (ref 26.0–34.0)
MCHC: 34.5 g/dL (ref 30.0–36.0)
MCV: 88.6 fL (ref 80.0–100.0)
Platelets: 209 10*3/uL (ref 150–400)
RBC: 4.91 MIL/uL (ref 4.22–5.81)
RDW: 12.5 % (ref 11.5–15.5)
WBC: 6.6 10*3/uL (ref 4.0–10.5)
nRBC: 0 % (ref 0.0–0.2)

## 2019-05-27 LAB — SARS CORONAVIRUS 2 BY RT PCR (HOSPITAL ORDER, PERFORMED IN ~~LOC~~ HOSPITAL LAB): SARS Coronavirus 2: NEGATIVE

## 2019-05-27 SURGERY — REPAIR, TENDON, ACHILLES
Anesthesia: General | Site: Leg Lower | Laterality: Left

## 2019-05-27 MED ORDER — DEXAMETHASONE SODIUM PHOSPHATE 10 MG/ML IJ SOLN
INTRAMUSCULAR | Status: DC | PRN
  Administered 2019-05-27: 10 mg via INTRAVENOUS

## 2019-05-27 MED ORDER — ACETAMINOPHEN 325 MG PO TABS: 325.0000 mg | ORAL_TABLET | ORAL | Status: DC | PRN

## 2019-05-27 MED ORDER — PROPOFOL 10 MG/ML IV BOLUS
INTRAVENOUS | Status: DC | PRN
  Administered 2019-05-27: 260 mg via INTRAVENOUS

## 2019-05-27 MED ORDER — FENTANYL CITRATE (PF) 250 MCG/5ML IJ SOLN
INTRAMUSCULAR | Status: AC
  Filled 2019-05-27: qty 5

## 2019-05-27 MED ORDER — FENTANYL CITRATE (PF) 100 MCG/2ML IJ SOLN: 25.0000 ug | INTRAMUSCULAR | Status: DC | PRN

## 2019-05-27 MED ORDER — ONDANSETRON HCL 4 MG/2ML IJ SOLN: 4.0000 mg | Freq: Once | INTRAMUSCULAR | Status: DC | PRN

## 2019-05-27 MED ORDER — ROCURONIUM BROMIDE 10 MG/ML (PF) SYRINGE
PREFILLED_SYRINGE | INTRAVENOUS | Status: AC
  Filled 2019-05-27: qty 10

## 2019-05-27 MED ORDER — MIDAZOLAM HCL 5 MG/5ML IJ SOLN
INTRAMUSCULAR | Status: DC | PRN
  Administered 2019-05-27 (×2): 1 mg via INTRAVENOUS

## 2019-05-27 MED ORDER — PHENYLEPHRINE 40 MCG/ML (10ML) SYRINGE FOR IV PUSH (FOR BLOOD PRESSURE SUPPORT)
PREFILLED_SYRINGE | INTRAVENOUS | Status: DC | PRN
  Administered 2019-05-27 (×5): 80 ug via INTRAVENOUS

## 2019-05-27 MED ORDER — PROPOFOL 10 MG/ML IV BOLUS
INTRAVENOUS | Status: AC
  Filled 2019-05-27: qty 20

## 2019-05-27 MED ORDER — ACETAMINOPHEN 160 MG/5ML PO SOLN: 325.0000 mg | ORAL | Status: DC | PRN

## 2019-05-27 MED ORDER — ONDANSETRON HCL 4 MG/2ML IJ SOLN
INTRAMUSCULAR | Status: DC | PRN
  Administered 2019-05-27: 4 mg via INTRAVENOUS

## 2019-05-27 MED ORDER — SUCCINYLCHOLINE CHLORIDE 200 MG/10ML IV SOSY
PREFILLED_SYRINGE | INTRAVENOUS | Status: AC
  Filled 2019-05-27: qty 10

## 2019-05-27 MED ORDER — 0.9 % SODIUM CHLORIDE (POUR BTL) OPTIME
TOPICAL | Status: DC | PRN
  Administered 2019-05-27 (×2): 1000 mL

## 2019-05-27 MED ORDER — LACTATED RINGERS IV SOLN
INTRAVENOUS | Status: DC | PRN
  Administered 2019-05-27 (×2): via INTRAVENOUS

## 2019-05-27 MED ORDER — DEXAMETHASONE SODIUM PHOSPHATE 10 MG/ML IJ SOLN
INTRAMUSCULAR | Status: AC
  Filled 2019-05-27: qty 1

## 2019-05-27 MED ORDER — CEFAZOLIN SODIUM-DEXTROSE 2-4 GM/100ML-% IV SOLN
2.0000 g | INTRAVENOUS | Status: AC
  Administered 2019-05-27: 08:00:00 2 g via INTRAVENOUS
  Filled 2019-05-27: qty 100

## 2019-05-27 MED ORDER — SODIUM CHLORIDE 0.9 % IV SOLN
INTRAVENOUS | Status: DC | PRN
  Administered 2019-05-27: 08:00:00 40 ug/min via INTRAVENOUS

## 2019-05-27 MED ORDER — DOCUSATE SODIUM 100 MG PO CAPS: 100.0000 mg | ORAL_CAPSULE | Freq: Two times a day (BID) | ORAL | 0 refills | Status: DC

## 2019-05-27 MED ORDER — FENTANYL CITRATE (PF) 250 MCG/5ML IJ SOLN
INTRAMUSCULAR | Status: DC | PRN
  Administered 2019-05-27 (×3): 50 ug via INTRAVENOUS

## 2019-05-27 MED ORDER — PHENYLEPHRINE 40 MCG/ML (10ML) SYRINGE FOR IV PUSH (FOR BLOOD PRESSURE SUPPORT)
PREFILLED_SYRINGE | INTRAVENOUS | Status: AC
  Filled 2019-05-27: qty 10

## 2019-05-27 MED ORDER — SUGAMMADEX SODIUM 500 MG/5ML IV SOLN
INTRAVENOUS | Status: AC
  Filled 2019-05-27: qty 5

## 2019-05-27 MED ORDER — MIDAZOLAM HCL 2 MG/2ML IJ SOLN
INTRAMUSCULAR | Status: AC
  Filled 2019-05-27: qty 2

## 2019-05-27 MED ORDER — MEPERIDINE HCL 25 MG/ML IJ SOLN: 6.2500 mg | INTRAMUSCULAR | Status: DC | PRN

## 2019-05-27 MED ORDER — ONDANSETRON HCL 4 MG/2ML IJ SOLN
INTRAMUSCULAR | Status: AC
  Filled 2019-05-27: qty 2

## 2019-05-27 MED ORDER — EPHEDRINE SULFATE-NACL 50-0.9 MG/10ML-% IV SOSY
PREFILLED_SYRINGE | INTRAVENOUS | Status: DC | PRN
  Administered 2019-05-27: 10 mg via INTRAVENOUS

## 2019-05-27 MED ORDER — OXYCODONE HCL 5 MG/5ML PO SOLN: 5.0000 mg | Freq: Once | ORAL | Status: DC | PRN

## 2019-05-27 MED ORDER — EPHEDRINE 5 MG/ML INJ
INTRAVENOUS | Status: AC
  Filled 2019-05-27: qty 10

## 2019-05-27 MED ORDER — OXYCODONE HCL 5 MG PO TABS: 5.0000 mg | ORAL_TABLET | ORAL | 0 refills | Status: AC | PRN

## 2019-05-27 MED ORDER — SUCCINYLCHOLINE CHLORIDE 200 MG/10ML IV SOSY
PREFILLED_SYRINGE | INTRAVENOUS | Status: DC | PRN
  Administered 2019-05-27: 150 mg via INTRAVENOUS

## 2019-05-27 MED ORDER — SUGAMMADEX SODIUM 200 MG/2ML IV SOLN
INTRAVENOUS | Status: DC | PRN
  Administered 2019-05-27: 240 mg via INTRAVENOUS

## 2019-05-27 MED ORDER — SENNA 8.6 MG PO TABS: 2.0000 | ORAL_TABLET | Freq: Two times a day (BID) | ORAL | 0 refills | Status: DC

## 2019-05-27 MED ORDER — CHLORHEXIDINE GLUCONATE 4 % EX LIQD: 60.0000 mL | Freq: Once | CUTANEOUS | Status: DC

## 2019-05-27 MED ORDER — BACITRACIN ZINC 500 UNIT/GM EX OINT
TOPICAL_OINTMENT | CUTANEOUS | Status: AC
  Filled 2019-05-27: qty 28.35

## 2019-05-27 MED ORDER — ROCURONIUM BROMIDE 10 MG/ML (PF) SYRINGE
PREFILLED_SYRINGE | INTRAVENOUS | Status: DC | PRN
  Administered 2019-05-27: 5 mg via INTRAVENOUS
  Administered 2019-05-27: 40 mg via INTRAVENOUS

## 2019-05-27 MED ORDER — ROPIVACAINE HCL 7.5 MG/ML IJ SOLN
INTRAMUSCULAR | Status: DC | PRN
  Administered 2019-05-27 (×4): 5 mL via PERINEURAL

## 2019-05-27 MED ORDER — OXYCODONE HCL 5 MG PO TABS: 5.0000 mg | ORAL_TABLET | Freq: Once | ORAL | Status: DC | PRN

## 2019-05-27 MED ORDER — LIDOCAINE 2% (20 MG/ML) 5 ML SYRINGE
INTRAMUSCULAR | Status: DC | PRN
  Administered 2019-05-27: 40 mg via INTRAVENOUS

## 2019-05-27 MED ORDER — ASPIRIN EC 81 MG PO TBEC: 81.0000 mg | DELAYED_RELEASE_TABLET | Freq: Two times a day (BID) | ORAL | 0 refills | Status: DC

## 2019-05-27 MED ORDER — SODIUM CHLORIDE 0.9 % IV SOLN: INTRAVENOUS | Status: DC

## 2019-05-27 MED ORDER — KETOROLAC TROMETHAMINE 30 MG/ML IJ SOLN: 30.0000 mg | Freq: Once | INTRAMUSCULAR | Status: DC | PRN

## 2019-05-27 MED ORDER — LIDOCAINE 2% (20 MG/ML) 5 ML SYRINGE
INTRAMUSCULAR | Status: AC
  Filled 2019-05-27: qty 5

## 2019-05-27 SURGICAL SUPPLY — 59 items
BANDAGE ESMARK 6X9 LF (GAUZE/BANDAGES/DRESSINGS) ×1 IMPLANT
BLADE SURG 10 STRL SS (BLADE) ×3 IMPLANT
BNDG COHESIVE 4X5 TAN STRL (GAUZE/BANDAGES/DRESSINGS) ×3 IMPLANT
BNDG COHESIVE 6X5 TAN STRL LF (GAUZE/BANDAGES/DRESSINGS) ×3 IMPLANT
BNDG ESMARK 6X9 LF (GAUZE/BANDAGES/DRESSINGS) ×3
CANISTER SUCT 3000ML PPV (MISCELLANEOUS) ×3 IMPLANT
CHLORAPREP W/TINT 26ML (MISCELLANEOUS) ×3 IMPLANT
COVER SURGICAL LIGHT HANDLE (MISCELLANEOUS) ×3 IMPLANT
COVER WAND RF STERILE (DRAPES) IMPLANT
CUFF TOURNIQUET SINGLE 34IN LL (TOURNIQUET CUFF) ×3 IMPLANT
CUFF TOURNIQUET SINGLE 44IN (TOURNIQUET CUFF) IMPLANT
DERMASPAN .5-.9MM 4X4CM SHOU (Miscellaneous) ×3 IMPLANT
DRAPE U-SHAPE 47X51 STRL (DRAPES) ×3 IMPLANT
DRSG ADAPTIC 3X8 NADH LF (GAUZE/BANDAGES/DRESSINGS) IMPLANT
DRSG MEPITEL 4X7.2 (GAUZE/BANDAGES/DRESSINGS) ×3 IMPLANT
DRSG PAD ABDOMINAL 8X10 ST (GAUZE/BANDAGES/DRESSINGS) ×6 IMPLANT
ELECT REM PT RETURN 9FT ADLT (ELECTROSURGICAL) ×3
ELECTRODE REM PT RTRN 9FT ADLT (ELECTROSURGICAL) ×1 IMPLANT
GAUZE SPONGE 4X4 12PLY STRL (GAUZE/BANDAGES/DRESSINGS) ×3 IMPLANT
GLOVE BIO SURGEON STRL SZ7 (GLOVE) ×6 IMPLANT
GLOVE BIO SURGEON STRL SZ8 (GLOVE) ×3 IMPLANT
GLOVE BIOGEL PI IND STRL 7.5 (GLOVE) ×1 IMPLANT
GLOVE BIOGEL PI IND STRL 8 (GLOVE) ×3 IMPLANT
GLOVE BIOGEL PI INDICATOR 7.5 (GLOVE) ×2
GLOVE BIOGEL PI INDICATOR 8 (GLOVE) ×6
GLOVE ECLIPSE 8.0 STRL XLNG CF (GLOVE) ×3 IMPLANT
GOWN STRL REUS W/ TWL LRG LVL3 (GOWN DISPOSABLE) ×2 IMPLANT
GOWN STRL REUS W/ TWL XL LVL3 (GOWN DISPOSABLE) ×1 IMPLANT
GOWN STRL REUS W/TWL LRG LVL3 (GOWN DISPOSABLE) ×4
GOWN STRL REUS W/TWL XL LVL3 (GOWN DISPOSABLE) ×2
KIT BASIN OR (CUSTOM PROCEDURE TRAY) ×3 IMPLANT
KIT TURNOVER KIT B (KITS) ×3 IMPLANT
NEEDLE 22X1 1/2 (OR ONLY) (NEEDLE) IMPLANT
NS IRRIG 1000ML POUR BTL (IV SOLUTION) ×3 IMPLANT
PACK ORTHO EXTREMITY (CUSTOM PROCEDURE TRAY) ×3 IMPLANT
PAD ARMBOARD 7.5X6 YLW CONV (MISCELLANEOUS) ×6 IMPLANT
PAD CAST 4YDX4 CTTN HI CHSV (CAST SUPPLIES) ×1 IMPLANT
PADDING CAST COTTON 4X4 STRL (CAST SUPPLIES) ×2
PADDING CAST COTTON 6X4 STRL (CAST SUPPLIES) ×3 IMPLANT
SOAP 2 % CHG 4 OZ (WOUND CARE) ×3 IMPLANT
SPONGE LAP 18X18 RF (DISPOSABLE) ×3 IMPLANT
SUCTION FRAZIER HANDLE 10FR (MISCELLANEOUS) ×2
SUCTION TUBE FRAZIER 10FR DISP (MISCELLANEOUS) ×1 IMPLANT
SUT ETHILON 3 0 FSL (SUTURE) ×3 IMPLANT
SUT MNCRL AB 3-0 PS2 18 (SUTURE) ×3 IMPLANT
SUT PROLENE 3 0 PS 2 (SUTURE) IMPLANT
SUT VIC AB 0 CTX 36 (SUTURE) ×2
SUT VIC AB 0 CTX36XBRD ANTBCTR (SUTURE) ×1 IMPLANT
SUT VIC AB 1 CT1 27 (SUTURE) ×8
SUT VIC AB 1 CT1 27XBRD ANBCTR (SUTURE) ×4 IMPLANT
SUT VIC AB 2-0 CT1 18 (SUTURE) ×3 IMPLANT
SUT VIC AB 3-0 PS2 18 (SUTURE) ×2
SUT VIC AB 3-0 PS2 18XBRD (SUTURE) ×1 IMPLANT
SYR CONTROL 10ML LL (SYRINGE) IMPLANT
TOWEL OR 17X24 6PK STRL BLUE (TOWEL DISPOSABLE) ×3 IMPLANT
TOWEL OR 17X26 10 PK STRL BLUE (TOWEL DISPOSABLE) ×3 IMPLANT
TUBE CONNECTING 12'X1/4 (SUCTIONS) ×1
TUBE CONNECTING 12X1/4 (SUCTIONS) ×2 IMPLANT
WATER STERILE IRR 1000ML POUR (IV SOLUTION) IMPLANT

## 2019-05-27 NOTE — Anesthesia Procedure Notes (Signed)
Anesthesia Regional Block: Popliteal block   Pre-Anesthetic Checklist: ,, timeout performed, Correct Patient, Correct Site, Correct Laterality, Correct Procedure, Correct Position, site marked, Risks and benefits discussed,  Surgical consent,  Pre-op evaluation,  At surgeon's request and post-op pain management  Laterality: Lower and Left  Prep: chloraprep       Needles:  Injection technique: Single-shot  Needle Type: Echogenic Stimulator Needle     Needle Length: 10cm  Needle Gauge: 21   Needle insertion depth: 2 cm   Additional Needles:   Procedures:,,,, ultrasound used (permanent image in chart),,,,  Narrative:  Start time: 05/27/2019 7:05 AM End time: 05/27/2019 7:15 AM Injection made incrementally with aspirations every 5 mL.  Performed by: Personally  Anesthesiologist: Lyn Hollingshead, MD

## 2019-05-27 NOTE — Op Note (Signed)
05/27/2019  9:15 AM  PATIENT:  Devon Griffith  58 y.o. male  PRE-OPERATIVE DIAGNOSIS: Chronic left Achilles tendon rupture  POST-OPERATIVE DIAGNOSIS: Same  Procedure(s): Left Achilles tendon reconstruction with Dermaspan allograft  SURGEON:  Wylene Simmer, MD  ASSISTANT: Mechele Claude, PA-C  ANESTHESIA:   General, regional  EBL:  minimal   TOURNIQUET:   Total Tourniquet Time Documented: Thigh (Left) - 87 minutes Total: Thigh (Left) - 87 minutes  COMPLICATIONS:  None apparent  DISPOSITION:  Extubated, awake and stable to recovery.  INDICATION FOR PROCEDURE: The patient is a 58 year old male who ruptured his left Achilles tendon several months ago.  He has continued having pain and weakness in the left calf and posterior ankle.  An MRI shows a chronic Achilles tendon rupture with a gap of several centimeters between the tendon ends.  He presents now for reconstruction of his left Achilles tendon.  The risks and benefits of the alternative treatment options have been discussed in detail.  The patient wishes to proceed with surgery and specifically understands risks of bleeding, infection, nerve damage, blood clots, need for additional surgery, amputation and death.  PROCEDURE IN DETAIL: After preoperative consent was obtained the correct operative site was identified, the patient was brought to the operating room supine on a stretcher.  General anesthesia was induced.  Preoperative antibiotics were administered.  Surgical timeout was taken.  The left lower extremity was then exsanguinated and a thigh tourniquet inflated to 250 mmHg.  The patient was then turned into the prone position on the operating table with all bony prominences padded well.  The left lower extremity was prepped and draped in standard sterile fashion.  A longitudinal incision was made in the midline over the palpable defect in the Achilles.  Sharp dissection was carried down through the skin and subcutaneous tissue  through the peritenon creating full-thickness flaps medially and laterally.  The Achilles tendon was identified.  Blunt dissection was carried around the tendon freeing it up from the surrounding peritenon.  A longitudinal split was then made in the tendon to allow identification of the tendon proximally and distally as well as the area of intervening fibrous scar tissue.  A Z shaped cut was made in the tendon.  There was approximately 3 cm between the identified tendon ends.  2 cm of the fibrous tissue was debrided from both sides of the tendon.  The deep fascia over the deep posterior compartment was incised and released proximally and distally exposing the FHL muscle.  The wound was irrigated copiously.  The tendon ends were approximated with a retention suture of 0 Vicryl.  #1 Vicryl Bunnell sutures were placed in both ends of the tendon with 4 strands of suture at either tendon end.  The sutures were then tied to each other in pair wise fashion.  The [place] test was noted to be appropriately restored.  The repair was then oversewn with a 3-0 Monocryl Silverskiold stitch circumferentially.  The wound was again irrigated.  A 4 cm x 4 cm dermaspan graft was then applied to the deep surface of the tendon.  It was sutured in place with 3-0 Vicryl simple sutures.  The wound was again irrigated.  The peritenon was repaired with inverted simple sutures of 3-0 Vicryl.  Skin incision was closed with horizontal mattress sutures of 3-0 nylon.  Sterile dressings were applied followed by a well-padded short leg splint.  The tourniquet was released after application of the dressings.  Patient was awakened from  anesthesia and transported to the recovery room in stable condition.   FOLLOW UP PLAN: Nonweightbearing on the left lower extremity in a splint for the next 2 weeks.  Follow-up in the office in 2 weeks for suture removal and conversion to a short leg cast.  Plan 6 weeks of nonweightbearing immobilization.     Mechele Claude PA-C was present and scrubbed for the duration of the operative case. His assistance was essential in positioning the patient, prepping and draping, gaining and maintaining exposure, performing the operation, closing and dressing the wounds and applying the splint.

## 2019-05-27 NOTE — Transfer of Care (Signed)
Immediate Anesthesia Transfer of Care Note  Patient: Devon Griffith  Procedure(s) Performed: Left Achilles tendon reconstruction (Left Leg Lower)  Patient Location: PACU  Anesthesia Type:GA combined with regional for post-op pain  Level of Consciousness: awake, alert , oriented, drowsy and patient cooperative  Airway & Oxygen Therapy: Patient Spontanous Breathing and Patient connected to face mask oxygen  Post-op Assessment: Report given to RN and Post -op Vital signs reviewed and stable  Post vital signs: Reviewed and stable  Last Vitals:  Vitals Value Taken Time  BP 135/75 05/27/2019  9:30 AM  Temp 36.4 C 05/27/2019  9:30 AM  Pulse 77 05/27/2019  9:30 AM  Resp 9 05/27/2019  9:30 AM  SpO2 99 % 05/27/2019  9:30 AM  Vitals shown include unvalidated device data.  Last Pain:  Vitals:   05/27/19 0653  TempSrc:   PainSc: 0-No pain      Patients Stated Pain Goal: 4 (37/04/88 8916)  Complications: No apparent anesthesia complications

## 2019-05-27 NOTE — Progress Notes (Signed)
Orthopedic Tech Progress Note Patient Details:  [name] [birth year] [phone] PACU RN called requesting crutches for patient. Talked to daughter about how dad should used the crutches with PACU RN at curbside with me. Ortho Devices Type of Ortho Device: Crutches Ortho Device/Splint Interventions: Other (comment)   Post Interventions Patient Tolerated: Other (comment) Instructions Provided: Other (comment)   Janit Pagan 05/27/2019, 11:18 AM

## 2019-05-27 NOTE — Discharge Instructions (Addendum)
Wylene Simmer, MD [facility]  Please read the following information regarding your care after surgery.  Medications  You only need a prescription for the narcotic pain medicine (ex. oxycodone, Percocet, Norco).  All of the other medicines listed below are available over the counter. X Aleve 2 pills twice a day for the first 3 days after surgery. X acetominophen (Tylenol) 650 mg every 4-6 hours as you need for minor to moderate pain X oxycodone as prescribed for severe pain  Narcotic pain medicine (ex. oxycodone, Percocet, Vicodin) will cause constipation.  To prevent this problem, take the following medicines while you are taking any pain medicine. X docusate sodium (Colace) 100 mg twice a day X senna (Senokot) 2 tablets twice a day  X To help prevent blood clots, take a baby aspirin (81 mg) twice a day after surgery.  You should also get up every hour while you are awake to move around.    Weight Bearing X Do not bear any weight on the operated leg or foot. ------- USE KNEE SCOOTER FOR MOBILITY !  Cast / Splint / Dressing X Keep your splint, cast or dressing clean and dry.  Dont put anything (coat hanger, pencil, etc) down inside of it.  If it gets damp, use a hair dryer on the cool setting to dry it.  If it gets soaked, call the office to schedule an appointment for a cast change.     After your dressing, cast or splint is removed; you may shower, but do not soak or scrub the wound.  Allow the water to run over it, and then gently pat it dry.  Swelling It is normal for you to have swelling where you had surgery.  To reduce swelling and pain, keep your toes above your nose for at least 3 days after surgery.  It may be necessary to keep your foot or leg elevated for several weeks.  If it hurts, it should be elevated.  Follow Up Call my office at [phone] when you are discharged from the hospital or surgery center to schedule an appointment to be seen two weeks after surgery.  Call  my office at [phone] if you develop a fever >101.5 F, nausea, vomiting, bleeding from the surgical site or severe pain.

## 2019-05-27 NOTE — Anesthesia Procedure Notes (Signed)
Procedure Name: Intubation Date/Time: 05/27/2019 7:38 AM Performed by: Raenette Rover, CRNA Pre-anesthesia Checklist: Patient identified, Emergency Drugs available, Suction available and Patient being monitored Patient Re-evaluated:Patient Re-evaluated prior to induction Oxygen Delivery Method: Circle system utilized Preoxygenation: Pre-oxygenation with 100% oxygen Induction Type: IV induction and Rapid sequence Laryngoscope Size: Mac and 4 Grade View: Grade I Tube type: Oral Tube size: 7.5 mm Number of attempts: 1 Airway Equipment and Method: Stylet Placement Confirmation: ETT inserted through vocal cords under direct vision,  positive ETCO2,  CO2 detector and breath sounds checked- equal and bilateral Secured at: 23 cm Tube secured with: Tape Dental Injury: Teeth and Oropharynx as per pre-operative assessment

## 2019-05-27 NOTE — H&P (Signed)
Devon Griffith is an 58 y.o. male.   Chief Complaint: left ankle pain HPI:  The patient is a 58 y/o male with PMH of sleep apnea who presents today with chronic left ankle pain and weakness after an achilles tendon rupture several months ago.  He has failed non op treatment and presents today for surgical reconstruction and gastrocnemius recession.  Past Medical History:  Diagnosis Date  . Achilles tendon disorder    Left  . Arrhythmia    wore monitor 2-3 weeks.aprox 5 years ago.  . H/O hiatal hernia    hx of  . Hyperlipidemia   . Hypertension   . Kidney stones   . Knee pain   . Pain in limb   . Sleep apnea    CPAP  settings at 10   . Urinary frequency     Past Surgical History:  Procedure Laterality Date  . EXTRACORPOREAL SHOCK WAVE LITHOTRIPSY Left 10/14/2018   Procedure: LEFT EXTRACORPOREAL SHOCK WAVE LITHOTRIPSY (ESWL);  Surgeon: Ceasar Mons, MD;  Location: [facility];  Service: Urology;  Laterality: Left;  . HERNIA REPAIR  2000  . KNEE ARTHROSCOPY W/ ACL RECONSTRUCTION  2002   left knee  . LITHOTRIPSY  2002  . VARICOSE VEIN SURGERY  2013   [facility]    Family History  Problem Relation Age of Onset  . Cancer Mother   . Emphysema Father    Social History:  reports that he has never smoked. He has never used smokeless tobacco. He reports previous alcohol use of about 12.0 standard drinks of alcohol per week. He reports that he does not use drugs.  Allergies: No Known Allergies  Medications Prior to Admission  Medication Sig Dispense Refill  . aspirin EC 81 MG tablet Take 81 mg by mouth daily.    Marland Kitchen buPROPion (WELLBUTRIN XL) 150 MG 24 hr tablet Take 150 mg by mouth daily.   12  . diltiazem (CARDIZEM CD) 240 MG 24 hr capsule Take 1 capsule (240 mg total) by mouth daily. 90 capsule 3  . rosuvastatin (CRESTOR) 20 MG tablet Take 1 tablet by mouth once daily 30 tablet 5  . tamsulosin (FLOMAX) 0.4 MG CAPS capsule Take 0.4 mg by mouth daily.       Results for orders placed or performed during the hospital encounter of 05/27/19 (from the past 48 hour(s))  SARS Coronavirus 2 (CEPHEID - Performed in [facility] hospital lab), Hosp Order     Status: None   Collection Time: 05/27/19  6:01 AM  Result Value Ref Range   SARS Coronavirus 2 NEGATIVE NEGATIVE    Comment: (NOTE) If result is NEGATIVE SARS-CoV-2 target nucleic acids are NOT DETECTED. The SARS-CoV-2 RNA is generally detectable in upper and lower  respiratory specimens during the acute phase of infection. The lowest  concentration of SARS-CoV-2 viral copies this assay can detect is 250  copies / mL. A negative result does not preclude SARS-CoV-2 infection  and should not be used as the sole basis for treatment or other  patient management decisions.  A negative result may occur with  improper specimen collection / handling, submission of specimen other  than nasopharyngeal swab, presence of viral mutation(s) within the  areas targeted by this assay, and inadequate number of viral copies  (<250 copies / mL). A negative result must be combined with clinical  observations, patient history, and epidemiological information. If result is POSITIVE SARS-CoV-2 target nucleic acids are DETECTED. The SARS-CoV-2 RNA  is generally detectable in upper and lower  respiratory specimens dur ing the acute phase of infection.  Positive  results are indicative of active infection with SARS-CoV-2.  Clinical  correlation with patient history and other diagnostic information is  necessary to determine patient infection status.  Positive results do  not rule out bacterial infection or co-infection with other viruses. If result is PRESUMPTIVE POSTIVE SARS-CoV-2 nucleic acids MAY BE PRESENT.   A presumptive positive result was obtained on the submitted specimen  and confirmed on repeat testing.  While 2019 novel coronavirus  (SARS-CoV-2) nucleic acids may be present in the submitted sample   additional confirmatory testing may be necessary for epidemiological  and / or clinical management purposes  to differentiate between  SARS-CoV-2 and other Sarbecovirus currently known to infect humans.  If clinically indicated additional testing with an alternate test  methodology [phone]) is advised. The SARS-CoV-2 RNA is generally  detectable in upper and lower respiratory sp ecimens during the acute  phase of infection. The expected result is Negative. Fact Sheet for Patients:  StrictlyIdeas.no Fact Sheet for Healthcare Providers: BankingDealers.co.za This test is not yet approved or cleared by the [place] FDA and has been authorized for detection and/or diagnosis of SARS-CoV-2 by FDA under an Emergency Use Authorization (EUA).  This EUA will remain in effect (meaning this test can be used) for the duration of the COVID-19 declaration under Section 564(b)(1) of the Act, 21 U.S.C. section 360bbb-3(b)(1), unless the authorization is terminated or revoked sooner. Performed at [facility], [place] [address], [place] [address]   CBC     Status: None   Collection Time: June 18, 2019  6:03 AM  Result Value Ref Range   WBC 6.6 4.0 - 10.5 K/uL   RBC 4.91 4.22 - 5.81 MIL/uL   Hemoglobin 15.0 13.0 - 17.0 g/dL   HCT 43.5 39.0 - 52.0 %   MCV 88.6 80.0 - 100.0 fL   MCH 30.5 26.0 - 34.0 pg   MCHC 34.5 30.0 - 36.0 g/dL   RDW 12.5 11.5 - 15.5 %   Platelets 209 150 - 400 K/uL   nRBC 0.0 0.0 - 0.2 %    Comment: Performed at [facility], [place] [address] [place], [place] [address]   No results found.  ROS  No recent f/c/n/v/wt loss.  Blood pressure (!) 136/91, pulse 64, temperature 98.6 F (37 C), temperature source Oral, resp. rate 18, height 5\' 11"  (1.803 m), weight 117.9 kg, SpO2 98 %. Physical Exam  wn wd male in nad.  A and ox  4.  Mood and affect normal.  EOMI.  resp unlabored.  L ankle with TTP at the achilles.  +  thompson test.  Pulses in the foot are palpable.  No lymphadenopathy.  Assessment/Plan Chronic left achilles tendon rupture - to the OR today for achilles tendon reconstruction; gastroc recession and possible transfer of the FHL to the calcaneus.  The risks and benefits of the alternative treatment options have been discussed in detail.  The patient wishes to proceed with surgery and specifically understands risks of bleeding, infection, nerve damage, blood clots, need for additional surgery, amputation and death.   Wylene Simmer, MD 06/18/2019, 7:22 AM

## 2019-05-27 NOTE — Anesthesia Postprocedure Evaluation (Signed)
Anesthesia Post Note  Patient: Devon Griffith  Procedure(s) Performed: Left Achilles tendon reconstruction (Left Leg Lower)     Patient location during evaluation: Phase II Anesthesia Type: General Level of consciousness: awake Pain management: pain level controlled Vital Signs Assessment: post-procedure vital signs reviewed and stable Respiratory status: spontaneous breathing Cardiovascular status: stable Postop Assessment: no apparent nausea or vomiting Anesthetic complications: no    Last Vitals:  Vitals:   05/27/19 0950 05/27/19 1000  BP: 121/70 112/61  Pulse: 72 68  Resp: 16 16  Temp:    SpO2: 96% 96%    Last Pain:  Vitals:   05/27/19 1000  TempSrc:   PainSc: 0-No pain   Pain Goal: Patients Stated Pain Goal: 4 (05/27/19 0653)                 Huston Foley

## 2019-05-28 ENCOUNTER — Encounter (HOSPITAL_COMMUNITY): Payer: Self-pay | Admitting: Orthopedic Surgery

## 2019-06-05 ENCOUNTER — Encounter (HOSPITAL_COMMUNITY): Payer: Self-pay | Admitting: Orthopedic Surgery

## 2019-06-16 ENCOUNTER — Ambulatory Visit: Payer: Commercial Managed Care - PPO | Admitting: Cardiovascular Disease

## 2019-06-16 ENCOUNTER — Telehealth: Payer: Self-pay | Admitting: Cardiovascular Disease

## 2019-06-16 NOTE — Telephone Encounter (Signed)
LMTCB to  Ask patient if rescheduled date and time (Tuesday, 07/08/19 IN OFFICE with Dr. Claiborne Billings) will work for him. Informed that if not, it can be rescheduled since he will be seeing patients in the office on full clinic days.

## 2019-07-08 ENCOUNTER — Telehealth: Payer: Self-pay | Admitting: Cardiovascular Disease

## 2019-07-08 ENCOUNTER — Other Ambulatory Visit: Payer: Self-pay

## 2019-07-08 ENCOUNTER — Encounter: Payer: Self-pay | Admitting: Cardiovascular Disease

## 2019-07-08 ENCOUNTER — Ambulatory Visit: Payer: Commercial Managed Care - PPO | Admitting: Cardiovascular Disease

## 2019-07-08 VITALS — BP 129/87 | HR 79 | Temp 98.4°F | Ht 71.0 in | Wt 279.0 lb

## 2019-07-08 DIAGNOSIS — I10 Essential (primary) hypertension: Secondary | ICD-10-CM | POA: Diagnosis not present

## 2019-07-08 DIAGNOSIS — G4733 Obstructive sleep apnea (adult) (pediatric): Secondary | ICD-10-CM

## 2019-07-08 DIAGNOSIS — Z6836 Body mass index (BMI) 36.0-36.9, adult: Secondary | ICD-10-CM

## 2019-07-08 DIAGNOSIS — E7800 Pure hypercholesterolemia, unspecified: Secondary | ICD-10-CM

## 2019-07-08 DIAGNOSIS — E66812 Obesity, class 2: Secondary | ICD-10-CM
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
## 2019-07-08 NOTE — Telephone Encounter (Signed)
LVM, reminding pt of his appt on 07-08-19 with Dr Claiborne Billings.

## 2019-07-08 NOTE — Patient Instructions (Signed)
Medication Instructions:  The current medical regimen is effective;  continue present plan and medications.  If you need a refill on your cardiac medications before your next appointment, please call your pharmacy.   Follow-Up: At [facility], you and your health needs are our priority.  As part of our continuing mission to provide you with exceptional heart care, we have created designated Provider Care Teams.  These Care Teams include your primary Cardiologist (physician) and Advanced Practice Providers (APPs -  Physician Assistants and Nurse Practitioners) who all work together to provide you with the care you need, when you need it. You will need a follow up appointment in 6 months.  Please call our office 2 months in advance to schedule this appointment.  You may see Dr.Kelly or one of the following Advanced Practice Providers on your designated Care Team: Almyra Deforest, [place] . Fabian Sharp, PA-C  Any Other Special Instructions Will Be Listed Below (If Applicable). Changed pressure from 5 to 7.

## 2019-07-09 NOTE — Addendum Note (Signed)
Addended by: Diana Eves on: 07/09/2019 04:29 PM   Modules accepted: Orders

## 2019-12-03 ENCOUNTER — Other Ambulatory Visit: Payer: Self-pay | Admitting: Cardiovascular Disease

## 2020-01-22 ENCOUNTER — Other Ambulatory Visit: Payer: Self-pay | Admitting: Cardiovascular Disease

## 2020-01-23 ENCOUNTER — Telehealth: Payer: Self-pay | Admitting: Cardiovascular Disease

## 2020-01-23 DIAGNOSIS — R0602 Shortness of breath: Secondary | ICD-10-CM

## 2020-01-23 NOTE — Telephone Encounter (Signed)
Spoke with pt regarding question of SOB. Informed of Dr.Kelly's advice of attempting to schedule an echo prior to his next office visit on 02/02/20 at 4:00pm. Informed pt that if SOB worsens to call 911 Pt agreeable. No questions or concerns at this time. Will send message to schedulers to schedule echo.

## 2020-01-23 NOTE — Telephone Encounter (Signed)
The patient has an appointment scheduled for February 02, 2020.  See if we can schedule for 2D echo Doppler study next week prior to his office visit

## 2020-01-23 NOTE — Telephone Encounter (Signed)
Spoke with wife who report pt was dx with COVID early December and has been experiencing SOB ever since. She report pt has had 2 treatment dose of prednisone but nothing is helping. Wife concerned that symptoms could now be related to heart.

## 2020-01-23 NOTE — Telephone Encounter (Signed)
New Message   Pts wife is calling and says the pt is having some symptoms that she is not sure is from recovering from [place] or if its from something else. She would like for the nurse to call her and review    Please call back

## 2020-01-27 NOTE — Telephone Encounter (Signed)
Order for echo placed

## 2020-01-28 ENCOUNTER — Other Ambulatory Visit: Payer: Self-pay

## 2020-01-28 ENCOUNTER — Ambulatory Visit (HOSPITAL_COMMUNITY): Payer: Commercial Managed Care - PPO | Attending: Internal Medicine

## 2020-01-28 DIAGNOSIS — R0602 Shortness of breath: Secondary | ICD-10-CM | POA: Insufficient documentation

## 2020-02-02 ENCOUNTER — Ambulatory Visit: Payer: Commercial Managed Care - PPO | Admitting: Cardiovascular Disease

## 2020-02-02 ENCOUNTER — Other Ambulatory Visit: Payer: Self-pay

## 2020-02-02 ENCOUNTER — Encounter: Payer: Self-pay | Admitting: Cardiovascular Disease

## 2020-02-02 DIAGNOSIS — U071 COVID-19: Secondary | ICD-10-CM

## 2020-02-02 DIAGNOSIS — I10 Essential (primary) hypertension: Secondary | ICD-10-CM

## 2020-02-02 DIAGNOSIS — R0602 Shortness of breath: Secondary | ICD-10-CM

## 2020-02-02 DIAGNOSIS — I519 Heart disease, unspecified: Secondary | ICD-10-CM | POA: Diagnosis not present

## 2020-02-02 DIAGNOSIS — G4733 Obstructive sleep apnea (adult) (pediatric): Secondary | ICD-10-CM

## 2020-02-02 DIAGNOSIS — I5189 Other ill-defined heart diseases: Secondary | ICD-10-CM
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
## 2020-02-02 NOTE — Patient Instructions (Signed)
Medication Instructions:  CONTINUE WITH CURRENT MEDICATIONS. NO CHANGES.  *If you need a refill on your cardiac medications before your next appointment, please call your pharmacy*    Testing/Procedures: A chest x-ray takes a picture of the organs and structures inside the chest, including the heart, lungs, and blood vessels. This test can show several things, including, whether the heart is enlarges; whether fluid is building up in the lungs; and whether pacemaker / defibrillator leads are still in place. THESE ARE DONE AT [facility]- NO APPT. NEEDED  [place], [place], [place] [id number]  Follow-Up: At [facility], you and your health needs are our priority.  As part of our continuing mission to provide you with exceptional heart care, we have created designated Provider Care Teams.  These Care Teams include your primary Cardiologist (physician) and Advanced Practice Providers (APPs -  Physician Assistants and Nurse Practitioners) who all work together to provide you with the care you need, when you need it.  Your next appointment:   6 month(s)  The format for your next appointment:   In Person  Provider:   Shelva Majestic, MD  [place]

## 2020-02-03 ENCOUNTER — Telehealth: Payer: Self-pay | Admitting: *Deleted

## 2020-02-03 NOTE — Telephone Encounter (Signed)
-----   Message from Darlyn Chamber June, RN sent at 02/02/2020  5:48 PM EST ----- Pt in need of AirFit F30i mask per [facility] Thank you!

## 2020-02-03 NOTE — Telephone Encounter (Signed)
CPAP mask order sent to [facility] via [employer].

## 2020-02-08 ENCOUNTER — Encounter: Payer: Self-pay | Admitting: Cardiovascular Disease

## 2020-02-09 ENCOUNTER — Other Ambulatory Visit: Payer: Self-pay

## 2020-02-09 ENCOUNTER — Ambulatory Visit
Admission: RE | Admit: 2020-02-09 | Discharge: 2020-02-09 | Disposition: A | Discharge status: Home / Self Care | Payer: Commercial Managed Care - PPO | Source: Ambulatory Visit | Attending: Cardiovascular Disease | Admitting: Cardiovascular Disease

## 2020-02-09 DIAGNOSIS — R0602 Shortness of breath: Secondary | ICD-10-CM

## 2020-02-09 DIAGNOSIS — G4733 Obstructive sleep apnea (adult) (pediatric): Secondary | ICD-10-CM

## 2020-03-10 ENCOUNTER — Ambulatory Visit: Payer: Commercial Managed Care - PPO | Admitting: Pulmonary Disease

## 2020-03-10 ENCOUNTER — Encounter: Payer: Self-pay | Admitting: Pulmonary Disease

## 2020-03-10 ENCOUNTER — Other Ambulatory Visit: Payer: Self-pay

## 2020-03-10 VITALS — BP 138/86 | HR 65 | Temp 97.3°F | Ht 71.0 in | Wt 278.6 lb

## 2020-03-10 DIAGNOSIS — R0602 Shortness of breath: Secondary | ICD-10-CM

## 2020-03-10 NOTE — Patient Instructions (Addendum)
Shortness of breath with exertion Chronic cough with sputum production   Continue Symbicort  We will get a pulmonary function test Your recent chest x-ray was normal  Continue with graded exercises as tolerated Call if you feel symptoms are consistent with an infection  Trial with a nasal steroid-Flonase, Nasonex are examples that may help her nasal congestion  I will see you in about 3 months

## 2020-03-10 NOTE — Progress Notes (Signed)
Subjective:    Patient ID: Devon Griffith, male    DOB: Nov 26, 1961, 58 y.o.   MRN: [id number]  Patient with recent diagnosis of Covid Did have a relatively mild disease with a day of fever, the day of headaches  Recovered but has had shortness of breath with activity since then Continues to feel fatigued  He is gradually trying to get back into exercising  He does have a chronic cough for which he brings up yellow phlegm Denies feeling acutely ill at present Has persistent nasal stuffiness congestion with some symptoms of postnasal drip  Never smoker Owns plastic molding business  Has a history of obstructive sleep apnea-follows up regularly Well-controlled  Has gained about 30 pounds since Covid restrictions  Patient was started on Symbicort and albuterol and did notice improvement in symptoms with use of Symbicort  Past Medical History:  Diagnosis Date  . Achilles tendon disorder    Left  . Arrhythmia    wore monitor 2-3 weeks.aprox 5 years ago.  . H/O hiatal hernia    hx of  . Hyperlipidemia   . Hypertension   . Kidney stones   . Knee pain   . Pain in limb   . Sleep apnea    CPAP  settings at 10   . Urinary frequency    Social History   Socioeconomic History  . Marital status: Married    Spouse name: Not on file  . Number of children: Not on file  . Years of education: Not on file  . Highest education level: Not on file  Occupational History  . Not on file  Tobacco Use  . Smoking status: Never Smoker  . Smokeless tobacco: Never Used  Substance and Sexual Activity  . Alcohol use: Not Currently    Alcohol/week: 12.0 standard drinks    Types: 12 Standard drinks or equivalent per week  . Drug use: No  . Sexual activity: Not on file  Other Topics Concern  . Not on file  Social History Narrative  . Not on file   Social Determinants of Health   Financial Resource Strain:   . Difficulty of Paying Living Expenses:   Food Insecurity:   . Worried  About Charity fundraiser in the Last Year:   . Arboriculturist in the Last Year:   Transportation Needs:   . Film/video editor (Medical):   Marland Kitchen Lack of Transportation (Non-Medical):   Physical Activity:   . Days of Exercise per Week:   . Minutes of Exercise per Session:   Stress:   . Feeling of Stress :   Social Connections:   . Frequency of Communication with Friends and Family:   . Frequency of Social Gatherings with Friends and Family:   . Attends Religious Services:   . Active Member of Clubs or Organizations:   . Attends Archivist Meetings:   Marland Kitchen Marital Status:   Intimate Partner Violence:   . Fear of Current or Ex-Partner:   . Emotionally Abused:   Marland Kitchen Physically Abused:   . Sexually Abused:    Family History  Problem Relation Age of Onset  . Cancer Mother   . Emphysema Father       Review of Systems  Constitutional: Positive for unexpected weight change. Negative for fever.  HENT: Negative for congestion, dental problem, ear pain, nosebleeds, postnasal drip, rhinorrhea, sinus pressure, sneezing, sore throat and trouble swallowing.   Eyes: Negative for redness and itching.  Respiratory: Positive for cough and shortness of breath. Negative for chest tightness and wheezing.   Cardiovascular: Negative for palpitations and leg swelling.  Gastrointestinal: Negative for nausea and vomiting.  Genitourinary: Positive for dysuria.  Musculoskeletal: Negative for joint swelling.  Skin: Negative for rash.  Allergic/Immunologic: Negative.  Negative for environmental allergies, food allergies and immunocompromised state.  Neurological: Negative for headaches.  Hematological: Does not bruise/bleed easily.  Psychiatric/Behavioral: Negative for dysphoric mood. The patient is not nervous/anxious.       Objective:   Physical Exam Constitutional:      Appearance: He is obese.  HENT:     Head: Normocephalic.     Nose: Nose normal. No congestion.     Mouth/Throat:      Mouth: Mucous membranes are moist.  Eyes:     Extraocular Movements: Extraocular movements intact.     Pupils: Pupils are equal, round, and reactive to light.  Cardiovascular:     Rate and Rhythm: Normal rate and regular rhythm.     Pulses: Normal pulses.     Heart sounds: Normal heart sounds. No murmur. No friction rub.  Pulmonary:     Effort: Pulmonary effort is normal. No respiratory distress.     Breath sounds: Normal breath sounds. No stridor. No wheezing.  Musculoskeletal:     Cervical back: Normal range of motion and neck supple. No rigidity or tenderness.  Neurological:     General: No focal deficit present.     Mental Status: He is alert.  Psychiatric:        Mood and Affect: Mood normal.    Vitals:   03/10/20 1048  BP: 138/86  Pulse: 65  Temp: (!) 97.3 F (36.3 C)  SpO2: 95%      Assessment & Plan:  .  Post Covid fatigue .  Post Covid shortness of breath  .  History of chronic cough with sputum production  .  Continue Symbicort and albuterol as needed  .  Trial with an inhaled nasal steroid for nasal stuffiness and congestion  .  Encouraged to continue graded exercises  .  Continue CPAP use on a regular basis  .  Obtain a pulmonary function study  .  Encouraged to call if he has any symptoms of an acute infection  .  We will follow up in about 3 months  .  His fatigue may take a while to resolve

## 2020-04-06 ENCOUNTER — Other Ambulatory Visit (HOSPITAL_COMMUNITY)
Admission: RE | Admit: 2020-04-06 | Discharge: 2020-04-06 | Disposition: A | Discharge status: Home / Self Care | Payer: Commercial Managed Care - PPO | Source: Ambulatory Visit | Attending: Pulmonary Disease | Admitting: Pulmonary Disease

## 2020-04-06 DIAGNOSIS — Z20822 Contact with and (suspected) exposure to covid-19: Secondary | ICD-10-CM | POA: Insufficient documentation

## 2020-04-06 DIAGNOSIS — Z01812 Encounter for preprocedural laboratory examination: Secondary | ICD-10-CM | POA: Insufficient documentation

## 2020-04-06 LAB — SARS CORONAVIRUS 2 (TAT 6-24 HRS): SARS Coronavirus 2: NEGATIVE

## 2020-04-09 ENCOUNTER — Ambulatory Visit: Payer: Commercial Managed Care - PPO | Admitting: Primary Care

## 2020-04-09 ENCOUNTER — Ambulatory Visit: Payer: Commercial Managed Care - PPO

## 2020-04-09 ENCOUNTER — Other Ambulatory Visit: Payer: Self-pay

## 2020-04-09 NOTE — Progress Notes (Deleted)
@Patient  ID: Devon Griffith, male    DOB: Mar 08, 1961, 59 y.o.   MRN: [id number]  No chief complaint on file.   Referring provider: Hulan Fess, MD  HPI: Patient is a 59 yo male, never smoker. PMH significant for OSA, HTN, hx of covid. Patient of Dr. Ander Slade last seen by him on 03/10/2020 for post covid SOB. He is using Symbicort BID and albuterol as needed. Also started inhaled nasal steroids for nasal stuffiness/congestion. Pt wears CPAP nightly for OSA. Pt also was ordered a PFT.   Imaging: CXR 02/09/2020: The heart size and mediastinal contours are within normal limits. Both lungs are clear. The visualized skeletal structures are unremarkable.  Echocardiogram:  Normal EF at 55 to 60%. Mild LVH, grade 2 diastolic dysfunction. Mild aortic sclerosis without stenosis. Mild dilation of aortic root at 41 mm.  PFT ordered; not been completed yet  04/09/2020  Patient here for follow up visit.    No Known Allergies  Immunization History  Administered Date(s) Administered  . Influenza,inj,Quad PF,6-35 Mos 10/19/2019  . PFIZER SARS-COV-2 Vaccination 02/26/2020    Past Medical History:  Diagnosis Date  . Achilles tendon disorder    Left  . Arrhythmia    wore monitor 2-3 weeks.aprox 5 years ago.  . H/O hiatal hernia    hx of  . Hyperlipidemia   . Hypertension   . Kidney stones   . Knee pain   . Pain in limb   . Sleep apnea    CPAP  settings at 10   . Urinary frequency     Tobacco History: Social History   Tobacco Use  Smoking Status Never Smoker  Smokeless Tobacco Never Used   Counseling given: Not Answered   Outpatient Medications Prior to Visit  Medication Sig Dispense Refill  . albuterol (VENTOLIN HFA) 108 (90 Base) MCG/ACT inhaler Inhale 2 puffs into the lungs every 6 (six) hours as needed for wheezing or shortness of breath.    Marland Kitchen aspirin EC 81 MG tablet Take 1 tablet (81 mg total) by mouth 2 (two) times daily. 90 tablet 0  . B Complex Vitamins (B COMPLEX  PO) Take 1 tablet by mouth daily.    . budesonide-formoterol (SYMBICORT) 160-4.5 MCG/ACT inhaler Inhale 2 puffs into the lungs 2 (two) times daily.    Marland Kitchen buPROPion (WELLBUTRIN) 75 MG tablet Take 75 mg by mouth daily.    Marland Kitchen diltiazem (CARDIZEM CD) 240 MG 24 hr capsule Take 1 capsule (240 mg total) by mouth daily. 90 capsule 3  . rosuvastatin (CRESTOR) 20 MG tablet Take 1 tablet by mouth once daily 90 tablet 0  . tamsulosin (FLOMAX) 0.4 MG CAPS capsule Take 0.4 mg by mouth daily.    Marland Kitchen VITAMIN D PO Take 1 tablet by mouth daily.     No facility-administered medications prior to visit.      Review of Systems  Review of Systems   Physical Exam  There were no vitals taken for this visit. Physical Exam   Lab Results:  CBC    Component Value Date/Time   WBC 6.6 05/27/2019 0603   RBC 4.91 05/27/2019 0603   HGB 15.0 05/27/2019 0603   HCT 43.5 05/27/2019 0603   PLT 209 05/27/2019 0603   MCV 88.6 05/27/2019 0603   MCH 30.5 05/27/2019 0603   MCHC 34.5 05/27/2019 0603   RDW 12.5 05/27/2019 0603   LYMPHSABS 2.1 09/24/2018 1720   MONOABS 0.7 09/24/2018 1720   EOSABS 0.0 09/24/2018 1720  BASOSABS 0.0 09/24/2018 1720    BMET    Component Value Date/Time   NA 138 05/27/2019 0603   K 4.5 05/27/2019 0603   CL 108 05/27/2019 0603   CO2 19 (L) 05/27/2019 0603   GLUCOSE 105 (H) 05/27/2019 0603   BUN 27 (H) 05/27/2019 0603   CREATININE 1.10 05/27/2019 0603   CREATININE 1.07 01/20/2015 1522   CALCIUM 8.9 05/27/2019 0603   GFRNONAA >60 05/27/2019 0603   GFRAA >60 05/27/2019 0603    BNP    Component Value Date/Time   BNP 25.7 09/24/2018 1720    ProBNP No results found for: PROBNP  Imaging: No results found.   Assessment & Plan:   No problem-specific Assessment & Plan notes found for this encounter.     Andres Labrum, [place] 04/09/2020

## 2020-04-12 ENCOUNTER — Encounter: Payer: Self-pay | Admitting: Primary Care

## 2020-04-12 ENCOUNTER — Other Ambulatory Visit: Payer: Self-pay

## 2020-04-12 ENCOUNTER — Ambulatory Visit (INDEPENDENT_AMBULATORY_CARE_PROVIDER_SITE_OTHER): Payer: Commercial Managed Care - PPO | Admitting: Pulmonary Disease

## 2020-04-12 ENCOUNTER — Ambulatory Visit: Payer: Commercial Managed Care - PPO | Admitting: Primary Care

## 2020-04-12 VITALS — BP 124/86 | HR 79 | Temp 97.6°F | Ht 70.0 in | Wt 275.6 lb

## 2020-04-12 DIAGNOSIS — K219 Gastro-esophageal reflux disease without esophagitis: Secondary | ICD-10-CM | POA: Insufficient documentation

## 2020-04-12 DIAGNOSIS — R0602 Shortness of breath: Secondary | ICD-10-CM

## 2020-04-12 DIAGNOSIS — R0600 Dyspnea, unspecified: Secondary | ICD-10-CM | POA: Diagnosis not present

## 2020-04-12 DIAGNOSIS — R0609 Other forms of dyspnea: Secondary | ICD-10-CM | POA: Insufficient documentation

## 2020-04-12 HISTORY — DX: Gastro-esophageal reflux disease without esophagitis: K21.9

## 2020-04-12 LAB — PULMONARY FUNCTION TEST
DL/VA % pred: 98 %
DL/VA: 4.16 ml/min/mmHg/L
DLCO cor % pred: 90 %
DLCO cor: 24.89 ml/min/mmHg
DLCO unc % pred: 90 %
DLCO unc: 24.89 ml/min/mmHg
FEF 25-75 Post: 5.05 L/sec
FEF 25-75 Pre: 4.18 L/sec
FEF2575-%Change-Post: 20 %
FEF2575-%Pred-Post: 169 %
FEF2575-%Pred-Pre: 140 %
FEV1-%Change-Post: 2 %
FEV1-%Pred-Post: 103 %
FEV1-%Pred-Pre: 101 %
FEV1-Post: 3.73 L
FEV1-Pre: 3.65 L
FEV1FVC-%Change-Post: 6 %
FEV1FVC-%Pred-Pre: 111 %
FEV6-%Change-Post: -4 %
FEV6-%Pred-Post: 89 %
FEV6-%Pred-Pre: 93 %
FEV6-Post: 4.07 L
FEV6-Pre: 4.27 L
FEV6FVC-%Change-Post: 0 %
FEV6FVC-%Pred-Post: 102 %
FEV6FVC-%Pred-Pre: 103 %
FVC-%Change-Post: -3 %
FVC-%Pred-Post: 87 %
FVC-%Pred-Pre: 90 %
FVC-Post: 4.17 L
FVC-Pre: 4.33 L
Post FEV1/FVC ratio: 90 %
Post FEV6/FVC ratio: 98 %
Pre FEV1/FVC ratio: 84 %
Pre FEV6/FVC Ratio: 99 %
RV % pred: 72 %
RV: 1.63 L
TLC % pred: 86 %
TLC: 6.06 L

## 2020-04-12 MED ORDER — ALBUTEROL SULFATE HFA 108 (90 BASE) MCG/ACT IN AERS: 2.0000 | INHALATION_SPRAY | Freq: Four times a day (QID) | RESPIRATORY_TRACT | 4 refills | Status: DC | PRN

## 2020-04-12 MED ORDER — FAMOTIDINE 40 MG PO TABS: 20.0000 mg | ORAL_TABLET | Freq: Two times a day (BID) | ORAL | 1 refills | Status: DC

## 2020-04-12 MED ORDER — FAMOTIDINE 40 MG PO TABS: 20.0000 mg | ORAL_TABLET | Freq: Every day | ORAL | 1 refills | Status: DC

## 2020-04-12 MED ORDER — PREDNISONE 10 MG PO TABS: ORAL_TABLET | ORAL | 0 refills | Status: DC

## 2020-04-12 NOTE — Patient Instructions (Addendum)
Flonase nasal spray once daily  Start Famotidine 40mg  at bedtime for GERD  Continue to work on weight loss and getting regular exercise   Wear CPAP every night for 4-6 hours or more each night  Do not drive if experiencing excessive daytime fatigue or somnolence   Speak with PCP about Wellbutrin dose and consider something to take as needed for anxiety  Labs today  Follow-up in 4 weeks with Dr. Ander Slade or Eustaquio Maize

## 2020-04-12 NOTE — Assessment & Plan Note (Addendum)
-   Patient reports increased dyspnea since diagnosed with Covid. He has been less active and did put on 30lbs, he also has hx of diastolic heart failure. He has noticed no benefit from ICS/LABA. His CXR was clear - PFTs today showed no evidence of obstructive or restrictive lung disease. Small airway reversibility. Normal diffusion capacity.  - Stop Symbicort, continue prn albuterol 2 puffs every 4-6 hours for sob/wheezing; add flonase nasal spray - Encourage patient to maintain healthy weight and work on increasing physical exercise. Recommend deep breathing exercises.  - Checking CBC with diff, CMET and BNP - If no improvement consider HRCT, pulmonary rehab

## 2020-04-12 NOTE — Progress Notes (Signed)
@Patient  ID: Devon Griffith, male    DOB: Jul 31, 1961, 59 y.o.   MRN: [id number]  Chief Complaint  Patient presents with  . Follow-up    F/U after PFT. States his breathing did improve but over the last 4 days, the SOB has come back.     Referring provider: Hulan Fess, MD  HPI: 59 year old male, never smoked. PMH significant for OSA, Covid-19, obesity. Patient of Dr. Ander Slade, seen for initial consult 03/10/20 for post covid shortness of breath and fatigue. He has gained close to 30 lbs since covid restrictions. He is gradually trying to get back into exercising. Started on Symbicort and albuterol, did not noticed improvement in symptoms. Added nasal steriod. Continue CPAP. Ordered PFTs.   04/12/2020 Patient presents today for follow-up with pulmonary function testing. He has felt ok until recently. His shortness of breath returned 4 days ago. He played golf yesterday and had a difficult time. He has a cough which is occasionally productive and other times it is dry. He is having to use his albuterol rescue inhaler a couple of times a day. He does not notice a difference with Symbicort.  He reports post nasal drip symptoms and experiences moderate wheezing at night. He has not added a nasal spray. He is compliant with CPAP which helps him sleep better. No significant swelling in his legs. He does have a moderate about of work related stress, he was started on Wellbutrin 75mg  daily.   Imaging: 02/10/20 CXR- no acute abnormality of the lungs  Cardiac: 01/28/20 Echocardiogram- EF 55-60%, mild LVH, grade 2 diastolic dysfunction. Mild aortic sclerosis without stenosis. Mild dilation of aortic root at 19mm.   PFTs 04/12/2020 - FVC 4.17 (87%), FEV1 3.73 (103), ratio 90, TLC 86%, DLCOcor 24.89 (90%)  No Known Allergies  Immunization History  Administered Date(s) Administered  . Influenza,inj,Quad PF,6-35 Mos 10/19/2019  . PFIZER SARS-COV-2 Vaccination 02/26/2020    Past Medical History:    Diagnosis Date  . Achilles tendon disorder    Left  . Arrhythmia    wore monitor 2-3 weeks.aprox 5 years ago.  Marland Kitchen GERD (gastroesophageal reflux disease) 04/12/2020  . H/O hiatal hernia    hx of  . Hyperlipidemia   . Hypertension   . Kidney stones   . Knee pain   . Pain in limb   . Sleep apnea    CPAP  settings at 10   . Urinary frequency     Tobacco History: Social History   Tobacco Use  Smoking Status Never Smoker  Smokeless Tobacco Never Used   Counseling given: Not Answered   Outpatient Medications Prior to Visit  Medication Sig Dispense Refill  . aspirin EC 81 MG tablet Take 1 tablet (81 mg total) by mouth 2 (two) times daily. 90 tablet 0  . B Complex Vitamins (B COMPLEX PO) Take 1 tablet by mouth daily.    . budesonide-formoterol (SYMBICORT) 160-4.5 MCG/ACT inhaler Inhale 2 puffs into the lungs 2 (two) times daily.    Marland Kitchen buPROPion (WELLBUTRIN) 75 MG tablet Take 75 mg by mouth daily.    Marland Kitchen diltiazem (CARDIZEM CD) 240 MG 24 hr capsule Take 1 capsule (240 mg total) by mouth daily. 90 capsule 3  . rosuvastatin (CRESTOR) 20 MG tablet Take 1 tablet by mouth once daily 90 tablet 0  . tamsulosin (FLOMAX) 0.4 MG CAPS capsule Take 0.4 mg by mouth daily.    Marland Kitchen VITAMIN D PO Take 1 tablet by mouth daily.    Marland Kitchen  albuterol (VENTOLIN HFA) 108 (90 Base) MCG/ACT inhaler Inhale 2 puffs into the lungs every 6 (six) hours as needed for wheezing or shortness of breath.     No facility-administered medications prior to visit.   Review of Systems  Review of Systems  Constitutional: Negative for unexpected weight change.  Respiratory: Positive for cough, shortness of breath and wheezing. Negative for chest tightness.   Cardiovascular: Negative.  Negative for leg swelling.   Physical Exam  BP 124/86 (BP Location: Left Arm, Patient Position: Sitting, Cuff Size: Large)   Pulse 79   Temp 97.6 F (36.4 C) (Temporal)   Ht 5\' 10"  (1.778 m)   Wt 275 lb 9.6 oz (125 kg)   SpO2 100% Comment: on  RA  BMI 39.54 kg/m  Physical Exam Constitutional:      Appearance: Normal appearance.  HENT:     Mouth/Throat:     Mouth: Mucous membranes are moist.     Pharynx: Oropharynx is clear.  Cardiovascular:     Rate and Rhythm: Normal rate and regular rhythm.  Pulmonary:     Effort: Pulmonary effort is normal.     Breath sounds: Normal breath sounds. No wheezing or rhonchi.     Comments: CTA Neurological:     General: No focal deficit present.     Mental Status: He is alert and oriented to person, place, and time. Mental status is at baseline.  Psychiatric:        Mood and Affect: Mood normal.        Behavior: Behavior normal.        Thought Content: Thought content normal.        Judgment: Judgment normal.      Lab Results:  CBC    Component Value Date/Time   WBC 6.6 05/27/2019 0603   RBC 4.91 05/27/2019 0603   HGB 15.0 05/27/2019 0603   HCT 43.5 05/27/2019 0603   PLT 209 05/27/2019 0603   MCV 88.6 05/27/2019 0603   MCH 30.5 05/27/2019 0603   MCHC 34.5 05/27/2019 0603   RDW 12.5 05/27/2019 0603   LYMPHSABS 2.1 09/24/2018 1720   MONOABS 0.7 09/24/2018 1720   EOSABS 0.0 09/24/2018 1720   BASOSABS 0.0 09/24/2018 1720    BMET    Component Value Date/Time   NA 138 05/27/2019 0603   K 4.5 05/27/2019 0603   CL 108 05/27/2019 0603   CO2 19 (L) 05/27/2019 0603   GLUCOSE 105 (H) 05/27/2019 0603   BUN 27 (H) 05/27/2019 0603   CREATININE 1.10 05/27/2019 0603   CREATININE 1.07 01/20/2015 1522   CALCIUM 8.9 05/27/2019 0603   GFRNONAA >60 05/27/2019 0603   GFRAA >60 05/27/2019 0603    BNP    Component Value Date/Time   BNP 25.7 09/24/2018 1720    ProBNP No results found for: PROBNP  Imaging: No results found.   Assessment & Plan:   Dyspnea on exertion - Patient reports increased dyspnea since diagnosed with Covid. He has been less active and did put on 30lbs, he also has hx of diastolic heart failure. He has noticed no benefit from ICS/LABA. His CXR was  clear - PFTs today showed no evidence of obstructive or restrictive lung disease. Small airway reversibility. Normal diffusion capacity.  - Stop Symbicort, continue prn albuterol 2 puffs every 4-6 hours for sob/wheezing; add flonase nasal spray - Encourage patient to maintain healthy weight and work on increasing physical exercise. Recommend deep breathing exercises.  - Checking CBC with diff, CMET  and BNP - If no improvement consider HRCT, pulmonary rehab    GERD (gastroesophageal reflux disease) - Start Famotidine 20mg  BID for nocturnal wheezing and GERD symptoms   Martyn Ehrich, NP 04/12/2020

## 2020-04-12 NOTE — Progress Notes (Signed)
PFT done today. 

## 2020-04-12 NOTE — Assessment & Plan Note (Signed)
-   Start Famotidine 20mg  BID for nocturnal wheezing and GERD symptoms

## 2020-05-10 ENCOUNTER — Telehealth: Payer: Self-pay | Admitting: Cardiovascular Disease

## 2020-05-10 NOTE — Telephone Encounter (Signed)
Patient's wife states she is calling in regards to an order for CPAP without the  mask, recommended for the patient by Dr. Claiborne Billings. [facility] will not provide a CPAP with no mask. She states she is requesting suggestions regarding who she needs to contact for CPAP with no mask. Please advise.

## 2020-05-12 ENCOUNTER — Other Ambulatory Visit: Payer: Self-pay

## 2020-05-12 MED ORDER — ROSUVASTATIN CALCIUM 20 MG PO TABS: 20.0000 mg | ORAL_TABLET | Freq: Every day | ORAL | 0 refills | Status: DC

## 2020-05-13 ENCOUNTER — Other Ambulatory Visit: Payer: Self-pay

## 2020-05-13 ENCOUNTER — Ambulatory Visit: Payer: Commercial Managed Care - PPO | Admitting: Primary Care

## 2020-05-13 NOTE — Telephone Encounter (Signed)
Follow up   Patients wife is following up in reference to mask for CPAP.

## 2020-05-14 ENCOUNTER — Other Ambulatory Visit: Payer: Self-pay | Admitting: Cardiovascular Disease

## 2020-06-22 ENCOUNTER — Telehealth: Payer: Self-pay | Admitting: Cardiovascular Disease

## 2020-06-22 NOTE — Telephone Encounter (Signed)
Devon Griffith is calling due to never hearing anything back in regards to her husbands new CPAP mask Dr. Claiborne Billings was wanting him to have. She states it has been over a month and she has called back several times in regards to this, but has never heard anything back. [name] reached out to his CPAP company and they had no idea what he was talking about when asking for the nose piece. Please advise.

## 2020-06-29 ENCOUNTER — Telehealth: Payer: Self-pay | Admitting: *Deleted

## 2020-06-29 NOTE — Telephone Encounter (Signed)
Follow Up  Patient is returning call. Please give patient/patient's wife a call back.

## 2020-06-29 NOTE — Telephone Encounter (Signed)
Returned a call to the patient's wife informing her the original mask order was placed on 2/16 21. I spoke with Gilda @ [facility] who told me they do not have it. I sent it in again today via [employer]. I will confirm receipt of order. If they still don't have it I will fax it to them. I will also have them to reach out to the patient to let them know when to expect the mask to arrive. I also apologized to her for any inconvenience that this delay might have caused.

## 2020-07-03 NOTE — Telephone Encounter (Signed)
Returned a call to the patient's wife informing her the original mask order was placed on 2/16 21. I spoke with Gilda @ [facility] who told me they do not have it. I sent it in again today via [employer]. I will confirm receipt of order. If they still don't have it I will fax it to them. I will also have them to reach out to the patient to let them know when to expect the mask to arrive. I also apologized to her for any inconvenience that this delay might have caused.

## 2020-07-08 ENCOUNTER — Other Ambulatory Visit: Payer: Self-pay | Admitting: Cardiovascular Disease

## 2020-08-12 ENCOUNTER — Encounter: Payer: Self-pay | Admitting: Pulmonary Disease

## 2020-08-12 ENCOUNTER — Other Ambulatory Visit: Payer: Self-pay

## 2020-08-12 ENCOUNTER — Ambulatory Visit: Payer: Commercial Managed Care - PPO | Admitting: Pulmonary Disease

## 2020-08-12 VITALS — BP 128/76 | HR 80 | Temp 97.8°F | Ht 71.0 in | Wt 276.6 lb

## 2020-08-12 DIAGNOSIS — R0602 Shortness of breath: Secondary | ICD-10-CM

## 2020-08-12 DIAGNOSIS — Z9989 Dependence on other enabling machines and devices: Secondary | ICD-10-CM

## 2020-08-12 DIAGNOSIS — G4733 Obstructive sleep apnea (adult) (pediatric): Secondary | ICD-10-CM | POA: Diagnosis not present

## 2020-08-12 MED ORDER — ALBUTEROL SULFATE HFA 108 (90 BASE) MCG/ACT IN AERS: 2.0000 | INHALATION_SPRAY | Freq: Four times a day (QID) | RESPIRATORY_TRACT | 5 refills | Status: DC | PRN

## 2020-08-12 NOTE — Progress Notes (Signed)
Subjective:    Patient ID: Devon Griffith, male    DOB: 10-23-1961, 59 y.o.   MRN: [id number]  Follow-up for shortness of breath  Was diagnosed with Covid, did recover from symptoms of Covid, did have relatively mild disease process but has since been left with chronic fatigue  Has had some weight gain Multiple ankle surgeries  Recently back to becoming more active and able to walk   Has a history of obstructive sleep apnea, uses CPAP on a regular basis Continues to benefit from CPAP use  Never smoker Owns plastic molding business  Has gained about 30 pounds since Covid restrictions  Patient was started on Symbicort and albuterol and did notice improvement in symptoms with use of Symbicort -He is no longer using Symbicort and has felt relatively well  Past Medical History:  Diagnosis Date  . Achilles tendon disorder    Left  . Arrhythmia    wore monitor 2-3 weeks.aprox 5 years ago.  Marland Kitchen GERD (gastroesophageal reflux disease) 04/12/2020  . H/O hiatal hernia    hx of  . Hyperlipidemia   . Hypertension   . Kidney stones   . Knee pain   . Pain in limb   . Sleep apnea    CPAP  settings at 10   . Urinary frequency    Social History   Socioeconomic History  . Marital status: Married    Spouse name: Not on file  . Number of children: Not on file  . Years of education: Not on file  . Highest education level: Not on file  Occupational History  . Not on file  Tobacco Use  . Smoking status: Never Smoker  . Smokeless tobacco: Never Used  Vaping Use  . Vaping Use: Never used  Substance and Sexual Activity  . Alcohol use: Not Currently    Alcohol/week: 12.0 standard drinks    Types: 12 Standard drinks or equivalent per week  . Drug use: No  . Sexual activity: Not on file  Other Topics Concern  . Not on file  Social History Narrative  . Not on file   Social Determinants of Health   Financial Resource Strain:   . Difficulty of Paying Living Expenses: Not on  file  Food Insecurity:   . Worried About Charity fundraiser in the Last Year: Not on file  . Ran Out of Food in the Last Year: Not on file  Transportation Needs:   . Lack of Transportation (Medical): Not on file  . Lack of Transportation (Non-Medical): Not on file  Physical Activity:   . Days of Exercise per Week: Not on file  . Minutes of Exercise per Session: Not on file  Stress:   . Feeling of Stress : Not on file  Social Connections:   . Frequency of Communication with Friends and Family: Not on file  . Frequency of Social Gatherings with Friends and Family: Not on file  . Attends Religious Services: Not on file  . Active Member of Clubs or Organizations: Not on file  . Attends Archivist Meetings: Not on file  . Marital Status: Not on file  Intimate Partner Violence:   . Fear of Current or Ex-Partner: Not on file  . Emotionally Abused: Not on file  . Physically Abused: Not on file  . Sexually Abused: Not on file   Family History  Problem Relation Age of Onset  . Cancer Mother   . Emphysema Father  Review of Systems  Constitutional: Positive for unexpected weight change. Negative for fever.  HENT: Negative for congestion, dental problem, ear pain, nosebleeds, postnasal drip, rhinorrhea, sinus pressure, sneezing, sore throat and trouble swallowing.   Eyes: Negative for redness and itching.  Respiratory: Positive for shortness of breath. Negative for cough, chest tightness and wheezing.   Cardiovascular: Negative for palpitations and leg swelling.  Gastrointestinal: Negative for nausea and vomiting.  Genitourinary: Negative for dysuria.  Musculoskeletal: Negative for joint swelling.  Skin: Negative for rash.  Allergic/Immunologic: Negative.  Negative for environmental allergies, food allergies and immunocompromised state.  Neurological: Negative for headaches.  Hematological: Does not bruise/bleed easily.  Psychiatric/Behavioral: Negative for dysphoric  mood. The patient is not nervous/anxious.       Objective:   Physical Exam Constitutional:      Appearance: He is obese.  HENT:     Head: Normocephalic.  Cardiovascular:     Rate and Rhythm: Normal rate and regular rhythm.     Pulses: Normal pulses.     Heart sounds: Normal heart sounds. No murmur heard.  No friction rub.  Pulmonary:     Effort: Pulmonary effort is normal. No respiratory distress.     Breath sounds: Normal breath sounds. No stridor. No wheezing.  Musculoskeletal:     Cervical back: Normal range of motion and neck supple. No rigidity or tenderness.  Neurological:     Mental Status: He is alert.    Vitals:   08/12/20 0954  BP: 128/76  Pulse: 80  Temp: 97.8 F (36.6 C)  SpO2: 97%      Assessment & Plan:  .  Post Covid fatigue -Albuterol as needed for the shortness of breath  Obstructive sleep apnea -Encouraged to continue CPAP use on a regular basis  Graded exercise as tolerated  Follow-up in 6 months

## 2020-08-12 NOTE — Patient Instructions (Signed)
Improved symptoms Persistent fatigue  Probably still related to your past Covid infection  Graded exercise as tolerated Continue with albuterol as needed  You may stay off Symbicort  Continue using CPAP on a regular basis Call with significant concerns   I will see you in 6 months

## 2020-11-18 ENCOUNTER — Ambulatory Visit: Payer: Commercial Managed Care - PPO | Admitting: Cardiovascular Disease

## 2020-12-22 ENCOUNTER — Other Ambulatory Visit: Payer: Self-pay

## 2020-12-22 ENCOUNTER — Encounter: Payer: Self-pay | Admitting: Cardiovascular Disease

## 2020-12-22 ENCOUNTER — Ambulatory Visit: Payer: Commercial Managed Care - PPO | Admitting: Cardiovascular Disease

## 2020-12-22 VITALS — BP 150/92 | HR 71 | Temp 98.1°F | Ht 71.0 in | Wt 279.2 lb

## 2020-12-22 DIAGNOSIS — G4733 Obstructive sleep apnea (adult) (pediatric): Secondary | ICD-10-CM

## 2020-12-22 DIAGNOSIS — I10 Essential (primary) hypertension: Secondary | ICD-10-CM

## 2020-12-22 DIAGNOSIS — I5189 Other ill-defined heart diseases: Secondary | ICD-10-CM | POA: Diagnosis not present

## 2020-12-22 DIAGNOSIS — U071 COVID-19: Secondary | ICD-10-CM

## 2020-12-22 DIAGNOSIS — Z6836 Body mass index (BMI) 36.0-36.9, adult: Secondary | ICD-10-CM

## 2020-12-22 DIAGNOSIS — E7800 Pure hypercholesterolemia, unspecified: Secondary | ICD-10-CM

## 2020-12-22 NOTE — Patient Instructions (Signed)
Medication Instructions:  No changes *If you need a refill on your cardiac medications before your next appointment, please call your pharmacy*   Lab Work: None ordered If you have labs (blood work) drawn today and your tests are completely normal, you will receive your results only by: Marland Kitchen MyChart Message (if you have MyChart) OR . A paper copy in the mail If you have any lab test that is abnormal or we need to change your treatment, we will call you to review the results.   Testing/Procedures: None ordered   Follow-Up: At [facility], you and your health needs are our priority.  As part of our continuing mission to provide you with exceptional heart care, we have created designated Provider Care Teams.  These Care Teams include your primary Cardiologist (physician) and Advanced Practice Providers (APPs -  Physician Assistants and Nurse Practitioners) who all work together to provide you with the care you need, when you need it.  We recommend signing up for the patient portal called "MyChart".  Sign up information is provided on this After Visit Summary.  MyChart is used to connect with patients for Virtual Visits (Telemedicine).  Patients are able to view lab/test results, encounter notes, upcoming appointments, etc.  Non-urgent messages can be sent to your provider as well.   To learn more about what you can do with MyChart, go to [email or web address].    Your next appointment:   6 month(s)  The format for your next appointment:   In Person  Provider:   Nicki Guadalajara, MD   Other Instructions Your sleep device settings have been remotely updated in the office today and ready for use tonight.
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
## 2020-12-23 ENCOUNTER — Encounter: Payer: Self-pay | Admitting: Cardiovascular Disease

## 2021-01-24 NOTE — Progress Notes (Signed)
Assessment/Plan:    1.  Tremor, intermittent, by hx  -Did not see a light of tremor on his examination today.  Saw a little bit of tremor when he was pouring water from 1 glass to another, but was overall mild.  He does state that tremor is intermittent.  We talked about the fact that things can make tremor worse such as albuterol and GAD.  Because he has had some labs with hyperglycemia and had some evidence of neuropathy on examination, I went ahead and checked a hemoglobin A1c just to make sure that the tremor was not a blood sugar issue.  I did not recommend any medication and he agreed with that.  -He has had some episodes of loss of balance.  His neuro exam is nonfocal and nonlateralizing.  Offered MRI of the brain but we decided to hold off on that for now.  Wonder if loss of balance was/is some peripheral neuropathy.  He is noticing some degree of paresthesias in the feet.  He has almost no reflexes.  His B12 was recently checked and was supratherapeutic (patient taking supplement).  As above, we will check his A1c.   Subjective:   Devon Griffith was seen today in the movement disorders clinic for neurologic consultation at the request of Vernie Shanks, MD.  The consultation is for the evaluation of tremor.  Medical records made available to me are reviewed.  Patient apparently has had 3 episodes of Covid (11/2019 and 06/2020 and 11/2020) and felt that tremor and clumsiness/dropping objects has developed since that time.  Pulmonary records indicate a 30 pound weight gain during Covid as well.  In addition, patient was started on Symbicort and albuterol (no longer taking Symbicort).   Tremor: Yes.   , intermittent with eating, tying fly on line doing fly fishing  How long has it been going on? 6 months  At rest or with activation?  activation  Fam hx of tremor?  Yes, brother (but he has GAD and bipolar d/o)  Located where?  B/l but mostly in dominant hand b/c uses it most  Affected by  caffeine:  No.  Affected by alcohol:  No. (10 drinks/week - may be beer/wine/bourbon and usually on the weekend)  Affected by stress:  Potentially - under lots of work stress  Spills soup if on spoon:  Yes.  , spills some  Tremor inducing meds:  Yes.   albuterol, but only uses as needed and not much over the last 2 months  Other Specific Symptoms:  Voice: no change Sleep: uses cpap and sleeping some better over the last month Postural symptoms:  Yes.   - started perhaps 1.5 years ago and would fall into a wall Bradykinesia symptoms: no bradykinesia noted (mild trouble OOC due to knee OA) Loss of smell:  No. Loss of taste:  No. Urinary Incontinence:  No., but some frequency due to nephrolithiasis Difficulty Swallowing:  No. Handwriting, micrographia: Yes.  , some Trouble with ADL's:  No. but will put on pants sitting up  Trouble buttoning clothing: No., even with collar buttons Depression:  Yes.   better than in the past but some GAD Memory changes:  Yes.  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  visual distortions: No. N/V:  No. Lightheaded:  No.  Syncope: No. Diplopia:  No. Dyskinesia:  No.  Neuroimaging of the brain has not previously been performed.  PREVIOUS MEDICATIONS:  none to date  ALLERGIES:   Allergies  Allergen Reactions   Bupropion     Other reaction(s): anxiety    CURRENT MEDICATIONS:  Current Outpatient Medications  Medication Instructions   albuterol (VENTOLIN HFA) 108 (90 Base) MCG/ACT inhaler 2 puffs, Inhalation, Every 6 hours PRN   aspirin EC 81 mg, Oral, 2 times daily   B Complex Vitamins (B COMPLEX PO) 1 tablet, Oral, Daily   buPROPion (WELLBUTRIN) 75 mg, Oral, Daily   diltiazem (CARDIZEM CD) 240 mg, Oral, Daily   rosuvastatin (CRESTOR) 20 MG tablet Take 1 tablet by mouth once daily   tamsulosin (FLOMAX) 0.4 mg, Oral, Daily   VITAMIN D PO 1 tablet, Oral, Daily    Objective:    PHYSICAL EXAMINATION:    VITALS:   Vitals:   01/26/21 1014  BP: 134/90  Pulse: 68  SpO2: 99%  Weight: 280 lb (127 kg)  Height: 5\' 11"  (1.803 m)    GEN:  The patient appears stated age and is in NAD. HEENT:  Normocephalic, atraumatic.  The mucous membranes are moist. The superficial temporal arteries are without ropiness or tenderness. CV:  RRR Lungs:  CTAB Neck/HEME:  There are no carotid bruits bilaterally.  Neurological examination:  Orientation: The patient is alert and oriented x3.  Cranial nerves: There is good facial symmetry.  Extraocular muscles are intact. The visual fields are full to confrontational testing. The speech is fluent and clear. Soft palate rises symmetrically and there is no tongue deviation. Hearing is intact to conversational tone. Sensation: Sensation is intact to light touch throughout (facial, trunk, extremities). Vibration is decreased in a distal fashion. There is no extinction with double simultaneous stimulation.  Motor: Strength is 5/5 in the bilateral upper and lower extremities.   Shoulder shrug is equal and symmetric.  There is no pronator drift. Deep tendon reflexes: Deep tendon reflexes are 0-1/4 at the bilateral biceps, triceps, brachioradialis, patella and achilles. Plantar responses are downgoing bilaterally.  Movement examination: Tone: There is normal tone in the bilateral upper extremities.  The tone in the lower extremities is normal.  Abnormal movements: No rest tremor even with distraction procedures.  No postural tremor.  No significant intention tremor.  Does well with Archimedes spirals.  Very little tremor when asked to pour water from 1 glass to another.  Does not spill the water. Coordination:  There is no decremation with RAM's, with any form of RAMS, including alternating supination and pronation of the forearm, hand opening and closing, finger taps, heel taps and toe taps. Gait and Station: The patient has no difficulty arising  out of a deep-seated chair without the use of the hands. The patient's stride length is good.   I have reviewed and interpreted the following labs independently   Chemistry      Component Value Date/Time   NA 138 05/27/2019 0603   K 4.5 05/27/2019 0603   CL 108 05/27/2019 0603   CO2 19 (L) 05/27/2019 0603   BUN 27 (H) 05/27/2019 0603   CREATININE 1.10 05/27/2019 0603   CREATININE 1.07 01/20/2015 1522      Component Value Date/Time   CALCIUM 8.9 05/27/2019 0603   ALKPHOS 40 01/20/2015 1522   AST 15 01/20/2015 1522   ALT 35 01/20/2015 1522   BILITOT 0.6 01/20/2015 1522      Lab Results  Component Value Date   TSH 2.112 01/20/2015   Lab Results  Component Value Date   WBC 6.6 05/27/2019   HGB 15.0  05/27/2019   HCT 43.5 05/27/2019   MCV 88.6 05/27/2019   PLT 209 05/27/2019   Patient had lab work with primary care on February 7.  White blood cell 6.0, hemoglobin 16.7, hematocrit 48.3 and platelets 246.  Sodium 141, potassium 4.4, chloride 104, BUN 30, creatinine 1.12, glucose 96, AST 12, ALT 24, sedimentation rate 19, TSH 4.04, B12 greater than 2000, folate 7.9   Total time spent on today's visit was 45 minutes, including both face-to-face time and nonface-to-face time.  Time included that spent on review of records (prior notes available to me/labs/imaging if pertinent), discussing treatment and goals, answering patient's questions and coordinating care.  Cc:  Hulan Fess, MD

## 2021-01-26 ENCOUNTER — Encounter: Payer: Self-pay | Admitting: Neurology

## 2021-01-26 ENCOUNTER — Ambulatory Visit: Payer: Commercial Managed Care - PPO | Admitting: Neurology

## 2021-01-26 ENCOUNTER — Other Ambulatory Visit (INDEPENDENT_AMBULATORY_CARE_PROVIDER_SITE_OTHER): Payer: Commercial Managed Care - PPO

## 2021-01-26 ENCOUNTER — Other Ambulatory Visit: Payer: Self-pay

## 2021-01-26 VITALS — BP 134/90 | HR 68 | Ht 71.0 in | Wt 280.0 lb

## 2021-01-26 DIAGNOSIS — R739 Hyperglycemia, unspecified: Secondary | ICD-10-CM

## 2021-01-26 DIAGNOSIS — R251 Tremor, unspecified: Secondary | ICD-10-CM

## 2021-01-26 DIAGNOSIS — R2681 Unsteadiness on feet: Secondary | ICD-10-CM | POA: Diagnosis not present

## 2021-01-26 LAB — HEMOGLOBIN A1C: Hgb A1c MFr Bld: 5.5 % (ref 4.6–6.5)

## 2021-01-26 NOTE — Patient Instructions (Signed)
Your provider has requested that you have labwork completed today. Please go to [facility] ([phone]) on the second floor of this building before leaving the office today. You do not need to check in. If you are not called within 15 minutes please check with the front desk.   Call us if things get worse or new neurologic symptoms arise  It was good to see you today!  The physicians and staff at [facility] are committed to providing excellent care. You may receive a survey requesting feedback about your experience at our office. We strive to receive "very good" responses to the survey questions. If you feel that your experience would prevent you from giving the office a "very good " response, please contact our office to try to remedy the situation. We may be reached at [phone]. Thank you for taking the time out of your busy day to complete the survey.

## 2021-03-14 ENCOUNTER — Ambulatory Visit
Admission: RE | Admit: 2021-03-14 | Discharge: 2021-03-14 | Disposition: A | Discharge status: Home / Self Care | Payer: Commercial Managed Care - PPO | Source: Ambulatory Visit | Attending: Gastroenterology | Admitting: Gastroenterology

## 2021-03-14 ENCOUNTER — Other Ambulatory Visit: Payer: Self-pay | Admitting: Gastroenterology

## 2021-03-14 DIAGNOSIS — K5900 Constipation, unspecified: Secondary | ICD-10-CM

## 2021-06-29 DIAGNOSIS — F4323 Adjustment disorder with mixed anxiety and depressed mood: Secondary | ICD-10-CM | POA: Diagnosis not present

## 2021-07-07 DIAGNOSIS — F4323 Adjustment disorder with mixed anxiety and depressed mood: Secondary | ICD-10-CM | POA: Diagnosis not present

## 2021-07-21 DIAGNOSIS — I517 Cardiomegaly: Secondary | ICD-10-CM | POA: Diagnosis not present

## 2021-07-21 DIAGNOSIS — R0602 Shortness of breath: Secondary | ICD-10-CM | POA: Diagnosis not present

## 2021-07-21 DIAGNOSIS — I358 Other nonrheumatic aortic valve disorders: Secondary | ICD-10-CM | POA: Diagnosis not present

## 2021-07-23 ENCOUNTER — Other Ambulatory Visit: Payer: Self-pay | Admitting: Cardiovascular Disease

## 2021-07-26 DIAGNOSIS — F4323 Adjustment disorder with mixed anxiety and depressed mood: Secondary | ICD-10-CM | POA: Diagnosis not present

## 2021-08-01 DIAGNOSIS — Z20822 Contact with and (suspected) exposure to covid-19: Secondary | ICD-10-CM | POA: Diagnosis not present

## 2021-08-26 DIAGNOSIS — S60452A Superficial foreign body of right middle finger, initial encounter: Secondary | ICD-10-CM | POA: Diagnosis not present

## 2021-09-06 DIAGNOSIS — F4323 Adjustment disorder with mixed anxiety and depressed mood: Secondary | ICD-10-CM | POA: Diagnosis not present

## 2021-09-08 DIAGNOSIS — E559 Vitamin D deficiency, unspecified: Secondary | ICD-10-CM | POA: Diagnosis not present

## 2021-09-08 DIAGNOSIS — Z79899 Other long term (current) drug therapy: Secondary | ICD-10-CM | POA: Diagnosis not present

## 2021-09-08 DIAGNOSIS — I517 Cardiomegaly: Secondary | ICD-10-CM | POA: Diagnosis not present

## 2021-09-08 DIAGNOSIS — I10 Essential (primary) hypertension: Secondary | ICD-10-CM | POA: Diagnosis not present

## 2021-09-08 DIAGNOSIS — G4733 Obstructive sleep apnea (adult) (pediatric): Secondary | ICD-10-CM | POA: Diagnosis not present

## 2021-09-20 DIAGNOSIS — F4323 Adjustment disorder with mixed anxiety and depressed mood: Secondary | ICD-10-CM | POA: Diagnosis not present

## 2021-10-04 DIAGNOSIS — F4323 Adjustment disorder with mixed anxiety and depressed mood: Secondary | ICD-10-CM | POA: Diagnosis not present

## 2021-10-18 DIAGNOSIS — L820 Inflamed seborrheic keratosis: Secondary | ICD-10-CM | POA: Diagnosis not present

## 2021-10-18 DIAGNOSIS — L570 Actinic keratosis: Secondary | ICD-10-CM | POA: Diagnosis not present

## 2021-10-18 DIAGNOSIS — D2262 Melanocytic nevi of left upper limb, including shoulder: Secondary | ICD-10-CM | POA: Diagnosis not present

## 2021-10-18 DIAGNOSIS — L814 Other melanin hyperpigmentation: Secondary | ICD-10-CM | POA: Diagnosis not present

## 2021-10-18 DIAGNOSIS — D225 Melanocytic nevi of trunk: Secondary | ICD-10-CM | POA: Diagnosis not present

## 2021-10-18 DIAGNOSIS — L821 Other seborrheic keratosis: Secondary | ICD-10-CM | POA: Diagnosis not present

## 2021-10-19 DIAGNOSIS — M79674 Pain in right toe(s): Secondary | ICD-10-CM | POA: Diagnosis not present

## 2021-10-21 DIAGNOSIS — F4323 Adjustment disorder with mixed anxiety and depressed mood: Secondary | ICD-10-CM | POA: Diagnosis not present

## 2021-11-15 DIAGNOSIS — F4323 Adjustment disorder with mixed anxiety and depressed mood: Secondary | ICD-10-CM | POA: Diagnosis not present

## 2021-11-22 DIAGNOSIS — F4323 Adjustment disorder with mixed anxiety and depressed mood: Secondary | ICD-10-CM | POA: Diagnosis not present

## 2021-12-06 DIAGNOSIS — F4323 Adjustment disorder with mixed anxiety and depressed mood: Secondary | ICD-10-CM | POA: Diagnosis not present

## 2021-12-14 DIAGNOSIS — R059 Cough, unspecified: Secondary | ICD-10-CM | POA: Diagnosis not present

## 2021-12-14 DIAGNOSIS — J029 Acute pharyngitis, unspecified: Secondary | ICD-10-CM | POA: Diagnosis not present

## 2021-12-14 DIAGNOSIS — Z03818 Encounter for observation for suspected exposure to other biological agents ruled out: Secondary | ICD-10-CM | POA: Diagnosis not present

## 2021-12-14 DIAGNOSIS — R051 Acute cough: Secondary | ICD-10-CM | POA: Diagnosis not present

## 2021-12-23 DIAGNOSIS — F4323 Adjustment disorder with mixed anxiety and depressed mood: Secondary | ICD-10-CM | POA: Diagnosis not present

## 2022-01-05 DIAGNOSIS — F4323 Adjustment disorder with mixed anxiety and depressed mood: Secondary | ICD-10-CM | POA: Diagnosis not present

## 2022-01-18 DIAGNOSIS — F4323 Adjustment disorder with mixed anxiety and depressed mood: Secondary | ICD-10-CM | POA: Diagnosis not present

## 2022-02-03 DIAGNOSIS — F4323 Adjustment disorder with mixed anxiety and depressed mood: Secondary | ICD-10-CM | POA: Diagnosis not present

## 2022-02-13 DIAGNOSIS — F4323 Adjustment disorder with mixed anxiety and depressed mood: Secondary | ICD-10-CM | POA: Diagnosis not present

## 2022-03-09 DIAGNOSIS — Z0000 Encounter for general adult medical examination without abnormal findings: Secondary | ICD-10-CM | POA: Diagnosis not present

## 2022-03-09 DIAGNOSIS — I10 Essential (primary) hypertension: Secondary | ICD-10-CM | POA: Diagnosis not present

## 2022-03-09 DIAGNOSIS — E559 Vitamin D deficiency, unspecified: Secondary | ICD-10-CM | POA: Diagnosis not present

## 2022-03-09 DIAGNOSIS — Z79899 Other long term (current) drug therapy: Secondary | ICD-10-CM | POA: Diagnosis not present

## 2022-03-09 DIAGNOSIS — N529 Male erectile dysfunction, unspecified: Secondary | ICD-10-CM | POA: Diagnosis not present

## 2022-03-09 DIAGNOSIS — F4323 Adjustment disorder with mixed anxiety and depressed mood: Secondary | ICD-10-CM | POA: Diagnosis not present

## 2022-03-09 DIAGNOSIS — Z125 Encounter for screening for malignant neoplasm of prostate: Secondary | ICD-10-CM | POA: Diagnosis not present

## 2022-03-23 DIAGNOSIS — F4323 Adjustment disorder with mixed anxiety and depressed mood: Secondary | ICD-10-CM | POA: Diagnosis not present

## 2022-03-23 DIAGNOSIS — E291 Testicular hypofunction: Secondary | ICD-10-CM | POA: Diagnosis not present

## 2022-03-28 DIAGNOSIS — G4733 Obstructive sleep apnea (adult) (pediatric): Secondary | ICD-10-CM | POA: Diagnosis not present

## 2022-03-30 DIAGNOSIS — J019 Acute sinusitis, unspecified: Secondary | ICD-10-CM | POA: Diagnosis not present

## 2022-04-03 DIAGNOSIS — I10 Essential (primary) hypertension: Secondary | ICD-10-CM | POA: Diagnosis not present

## 2022-04-03 DIAGNOSIS — E291 Testicular hypofunction: Secondary | ICD-10-CM | POA: Diagnosis not present

## 2022-04-03 DIAGNOSIS — I517 Cardiomegaly: Secondary | ICD-10-CM | POA: Diagnosis not present

## 2022-04-03 DIAGNOSIS — G4733 Obstructive sleep apnea (adult) (pediatric): Secondary | ICD-10-CM | POA: Diagnosis not present

## 2022-04-10 DIAGNOSIS — F4323 Adjustment disorder with mixed anxiety and depressed mood: Secondary | ICD-10-CM | POA: Diagnosis not present

## 2022-05-23 DIAGNOSIS — F4323 Adjustment disorder with mixed anxiety and depressed mood: Secondary | ICD-10-CM | POA: Diagnosis not present

## 2022-06-12 DIAGNOSIS — F4323 Adjustment disorder with mixed anxiety and depressed mood: Secondary | ICD-10-CM | POA: Diagnosis not present

## 2022-06-26 DIAGNOSIS — F4323 Adjustment disorder with mixed anxiety and depressed mood: Secondary | ICD-10-CM | POA: Diagnosis not present

## 2022-06-27 DIAGNOSIS — H61813 Exostosis of external canal, bilateral: Secondary | ICD-10-CM | POA: Diagnosis not present

## 2022-06-27 DIAGNOSIS — H6123 Impacted cerumen, bilateral: Secondary | ICD-10-CM | POA: Diagnosis not present

## 2022-06-27 DIAGNOSIS — H90A32 Mixed conductive and sensorineural hearing loss, unilateral, left ear with restricted hearing on the contralateral side: Secondary | ICD-10-CM | POA: Diagnosis not present

## 2022-06-27 DIAGNOSIS — G4733 Obstructive sleep apnea (adult) (pediatric): Secondary | ICD-10-CM | POA: Diagnosis not present

## 2022-07-17 DIAGNOSIS — E559 Vitamin D deficiency, unspecified: Secondary | ICD-10-CM | POA: Diagnosis not present

## 2022-07-17 DIAGNOSIS — I10 Essential (primary) hypertension: Secondary | ICD-10-CM | POA: Diagnosis not present

## 2022-07-17 DIAGNOSIS — E291 Testicular hypofunction: Secondary | ICD-10-CM | POA: Diagnosis not present

## 2022-07-17 DIAGNOSIS — Z125 Encounter for screening for malignant neoplasm of prostate: Secondary | ICD-10-CM | POA: Diagnosis not present

## 2022-07-24 DIAGNOSIS — F4323 Adjustment disorder with mixed anxiety and depressed mood: Secondary | ICD-10-CM | POA: Diagnosis not present

## 2022-08-15 DIAGNOSIS — I10 Essential (primary) hypertension: Secondary | ICD-10-CM | POA: Diagnosis not present

## 2022-08-15 DIAGNOSIS — F4323 Adjustment disorder with mixed anxiety and depressed mood: Secondary | ICD-10-CM | POA: Diagnosis not present

## 2022-09-06 DIAGNOSIS — F4323 Adjustment disorder with mixed anxiety and depressed mood: Secondary | ICD-10-CM | POA: Diagnosis not present

## 2022-09-20 DIAGNOSIS — F4323 Adjustment disorder with mixed anxiety and depressed mood: Secondary | ICD-10-CM | POA: Diagnosis not present

## 2022-11-03 ENCOUNTER — Telehealth: Payer: Self-pay | Admitting: Cardiovascular Disease

## 2022-11-03 NOTE — Telephone Encounter (Signed)
New Message:     Patient would like to switch from Dr Claiborne Billings to Dr Harrell Gave. It is closer to patient's home and patient's wife see Dr Harrell Gave.

## 2022-11-06 NOTE — Telephone Encounter (Signed)
Ok by me

## 2022-11-07 NOTE — Telephone Encounter (Signed)
Ok by me

## 2022-12-01 ENCOUNTER — Other Ambulatory Visit: Payer: Self-pay

## 2022-12-01 ENCOUNTER — Other Ambulatory Visit (INDEPENDENT_AMBULATORY_CARE_PROVIDER_SITE_OTHER): Payer: BC Managed Care – PPO

## 2022-12-01 ENCOUNTER — Ambulatory Visit (HOSPITAL_BASED_OUTPATIENT_CLINIC_OR_DEPARTMENT_OTHER): Payer: BC Managed Care – PPO | Admitting: Cardiology

## 2022-12-01 ENCOUNTER — Encounter (HOSPITAL_BASED_OUTPATIENT_CLINIC_OR_DEPARTMENT_OTHER): Payer: Self-pay | Admitting: Cardiology

## 2022-12-01 VITALS — BP 130/88 | HR 94 | Ht 71.0 in | Wt 271.8 lb

## 2022-12-01 DIAGNOSIS — R002 Palpitations: Secondary | ICD-10-CM

## 2022-12-01 DIAGNOSIS — G4733 Obstructive sleep apnea (adult) (pediatric): Secondary | ICD-10-CM

## 2022-12-01 DIAGNOSIS — I10 Essential (primary) hypertension: Secondary | ICD-10-CM

## 2022-12-01 DIAGNOSIS — I471 Supraventricular tachycardia, unspecified: Secondary | ICD-10-CM

## 2022-12-01 DIAGNOSIS — Z6837 Body mass index (BMI) 37.0-37.9, adult: Secondary | ICD-10-CM

## 2022-12-01 DIAGNOSIS — R079 Chest pain, unspecified: Secondary | ICD-10-CM

## 2022-12-01 MED ORDER — METOPROLOL TARTRATE 25 MG PO TABS: 25.0000 mg | ORAL_TABLET | Freq: Four times a day (QID) | ORAL | 11 refills | Status: DC | PRN

## 2022-12-01 NOTE — Progress Notes (Signed)
Cardiology Office Note:    Date:  12/01/2022   ID:  Devon Griffith, DOB [birth year], MRN [id number]  PCP:  [facility] [place]  Cardiologist:  Buford Dresser, MD  Referring MD: No ref. provider found   CC: follow-up/establish care with me    History of Present Illness:    Devon Griffith is a 61 y.o. male with a hx of hyperlipidemia, hypertension, GERD, obstructive sleep apnea on CPAP, kidney stones who is seen for follow-up. He was previously followed by Dr. Claiborne Billings.   Per Dr. Evette Georges notes, he remotely had episodes of chest pain. A nuclear perfusion study in 2014 revealed normal perfusion without scar or ischemia. An echo Doppler study confirmed systolic ejection fraction at 60-65% and he had grade 1 diastolic dysfunction. A follow-up echo Doppler study subsequent to his Covid infection on January 28, 2020  normal EF at 55 to 60%, mild LVH, grade 2 diastolic dysfunction.   Today, he complains of more frequent and rapid palpitations. His palpitations have been intermittent for at least 10 years. However, only in the past 6 months they have been lasting for more than 3-4 minutes. Although they are more frequent lately, he states that they are sporadic and he may go 2 weeks without an episode. They usually occur while he is at rest, sitting. He has a Psychologist, sport and exercise which has shown his heart rate as high as 160 bpm. I personally reviewed his Kardia strips which are suggestive of SVT. He has tried beating his chest and coughing with no relief. His regimen has included Diltiazem for a long time.  He endorses a history of 3 COVID infections and has long-haul symptoms. He states that he doesn't breathe well at baseline. For activity he enjoys playing golf, but he is only able to walk 9 holes before becoming worn out. Also he usually walks 2 miles around his neighborhood. Lately he struggles to complete the final 10% of his route.  In the last 6-8 weeks he has noticed new  onset of chest pains.  Additionally he had issues with anxiety while working. He continues to take wellbutrin and denies depressive symptoms. Recently he has been "mega-stressed," although this is gradually lowering as he becomes adjusted to his recent retirement.  Due to having 3 LLE surgeries, his physical activity was limited and he gained 60 lbs. He has been working on dietary changes such as drinking lower-calorie beers. Mostly he will consume alcohol over the weekends. He has noticed that his alcohol consumption increased from 10-14 drinks a week to 18-20 drinks a week. He has never smoked.  Recently he received new parts for his CPAP. Sometimes he has to pull the mask back to get air. Despite this he is sleeping a little better.  Over the years his testosterone levels have dropped gradually.   He denies any lightheadedness, headaches, syncope, orthopnea, or PND.    Past Medical History:  Diagnosis Date   Achilles tendon disorder    Left   Arrhythmia    wore monitor 2-3 weeks.aprox 5 years ago.   GERD (gastroesophageal reflux disease) 04/12/2020   H/O hiatal hernia    hx of   Hyperlipidemia    Hypertension    Kidney stones    Knee pain    Pain in limb    Sleep apnea    CPAP  settings at 10    Urinary frequency     Past Surgical History:  Procedure Laterality Date   ACHILLES TENDON  SURGERY Left 05/27/2019   Procedure: Left Achilles tendon reconstruction;  Surgeon: Wylene Simmer, MD;  Location: [facility];  Service: Orthopedics;  Laterality: Left;   ANKLE SURGERY     EXTRACORPOREAL SHOCK WAVE LITHOTRIPSY Left 10/14/2018   Procedure: LEFT EXTRACORPOREAL SHOCK WAVE LITHOTRIPSY (ESWL);  Surgeon: Ceasar Mons, MD;  Location: [facility];  Service: Urology;  Laterality: Left;   HERNIA REPAIR  2000   KNEE ARTHROSCOPY W/ ACL RECONSTRUCTION  2002   left knee   LITHOTRIPSY  2002   VARICOSE VEIN SURGERY  2013   [facility]    Current Medications: Current  Outpatient Medications on File Prior to Visit  Medication Sig   albuterol (VENTOLIN HFA) 108 (90 Base) MCG/ACT inhaler Inhale 2 puffs into the lungs every 6 (six) hours as needed for wheezing or shortness of breath.   aspirin EC 81 MG tablet Take 1 tablet (81 mg total) by mouth 2 (two) times daily.   B Complex Vitamins (B COMPLEX PO) Take 1 tablet by mouth daily.   buPROPion (WELLBUTRIN XL) 300 MG 24 hr tablet Take 300 mg by mouth every morning.   rosuvastatin (CRESTOR) 20 MG tablet Take 1 tablet by mouth once daily   tamsulosin (FLOMAX) 0.4 MG CAPS capsule Take 0.4 mg by mouth daily.   Testosterone 1.62 % GEL Place 20.25 mg onto the skin daily.   VITAMIN D PO Take 1 tablet by mouth daily.   CARTIA XT 180 MG 24 hr capsule Take 1 capsule by mouth daily.   lisinopril (ZESTRIL) 10 MG tablet Take 10 mg by mouth daily.   No current facility-administered medications on file prior to visit.     Allergies:   Bupropion   Social History   Tobacco Use   Smoking status: Never   Smokeless tobacco: Never  Vaping Use   Vaping Use: Never used  Substance Use Topics   Alcohol use: Yes    Comment: 10 per week   Drug use: No    Family History: family history includes Bipolar disorder in his brother; Cancer in his mother; Emphysema in his father; Healthy in his son and son; Mental illness in his brother.  ROS:   Please see the history of present illness.  Additional pertinent ROS otherwise unremarkable.  EKGs/Labs/Other Studies Reviewed:    The following studies were reviewed today:  ECHO 01/28/2020:  1. Left ventricular ejection fraction, by estimation, is 55 to 60%. The  left ventricle has normal function. The left ventrical has no regional  wall motion abnormalities. There is mildly increased left ventricular  hypertrophy. Left ventricular diastolic  parameters are consistent with Grade II diastolic dysfunction  (pseudonormalization).   2. Right ventricular systolic function is normal.  The right ventricular  size is normal. There is normal pulmonary artery systolic pressure. The  estimated right ventricular systolic pressure is 50.5 mmHg.   3. The mitral valve is normal in structure and function. no evidence of  mitral valve regurgitation. No evidence of mitral stenosis.   4. The aortic valve is abnormal. Aortic valve regurgitation is not  visualized. Mild aortic valve sclerosis is present, with no evidence of  aortic valve stenosis.   5. Aortic dilatation noted. 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   6. The inferior vena cava is normal in size with greater than 50%  respiratory variability, suggesting right atrial pressure of 3 mmHg.   CTA Chest 11/24/2018: IMPRESSION: 1. No pulmonary embolus or  other acute thoracic abnormality. 2. 5 mm subpleural right middle lobe nodule. No follow-up needed if patient is low-risk. Non-contrast chest CT can be considered in 12 months if patient is high-risk. This recommendation follows the consensus statement: Guidelines for Management of Incidental Pulmonary Nodules Detected on CT Images: From the Fleischner Society 2017; Radiology 2017; 284:228-243.  EKG:  EKG is personally reviewed.   12/01/2022: NSR at 94 bpm  Recent Labs: No results found for requested labs within last 365 days.   Recent Lipid Panel    Component Value Date/Time   CHOL 138 01/20/2015 1522   CHOL 198 05/09/2013 1130   TRIG 155 (H) 01/20/2015 1522   TRIG 169 (H) 05/09/2013 1130   HDL 42 01/20/2015 1522   HDL 40 05/09/2013 1130   CHOLHDL 3.3 01/20/2015 1522   VLDL 31 01/20/2015 1522   LDLCALC 65 01/20/2015 1522   LDLCALC 124 (H) 05/09/2013 1130    Physical Exam:    VS:  BP 130/88 (BP Location: Left Arm, Patient Position: Sitting, Cuff Size: Large)   Pulse 94   Ht '5\' 11"'$  (1.803 m)   Wt 271 lb 12.8 oz (123.3 kg)   BMI 37.91 kg/m     Wt Readings from Last 3 Encounters:  12/01/22 271 lb 12.8 oz (123.3 kg)  01/26/21 280  lb (127 kg)  12/22/20 279 lb 3.2 oz (126.6 kg)    GEN: Well nourished, well developed in no acute distress HEENT: Normal, moist mucous membranes NECK: No JVD CARDIAC: regular rhythm, normal S1 and S2, no rubs or gallops. No murmur. VASCULAR: Radial and DP pulses 2+ bilaterally. No carotid bruits RESPIRATORY:  Clear to auscultation without rales, wheezing or rhonchi  ABDOMEN: Soft, non-tender, non-distended MUSCULOSKELETAL:  Ambulates independently SKIN: Warm and dry, trivial bilateral LE edema NEUROLOGIC:  Alert and oriented x 3. No focal neuro deficits noted. PSYCHIATRIC:  Normal affect    ASSESSMENT:    1. Heart palpitations   2. Essential hypertension   3. OSA (obstructive sleep apnea)   4. Class 2 severe obesity due to excess calories with serious comorbidity and body mass index (BMI) of 37.0 to 37.9 in adult [facility])   5. Chest pain of uncertain etiology   6. Paroxysmal SVT (supraventricular tachycardia)    PLAN:    Palpitations, chest discomfort History of pSVT -will order Zio to further evaluate -will start PRN metoprolol for symptoms  Hypertension -diastolic above goal, reassess at follow up  OSA Class 2 obesity -continue CPAP -has been working on lifestyle  Cardiac risk counseling and prevention recommendations: -recommend heart healthy/Mediterranean diet, with whole grains, fruits, vegetable, fish, lean meats, nuts, and olive oil. Limit salt. -recommend moderate walking, 3-5 times/week for 30-50 minutes each session. Aim for at least 150 minutes.week. Goal should be pace of 3 miles/hours, or walking 1.5 miles in 30 minutes -recommend avoidance of tobacco products. Avoid excess alcohol.  Plan for follow up: 4-6 weeks or sooner as needed.   Buford Dresser, MD, PhD, [facility]    Medication Adjustments/Labs and Tests Ordered: Current medicines are reviewed at length with the patient today.  Concerns regarding medicines are outlined  above.  Orders Placed This Encounter  Procedures   LONG TERM MONITOR (3-14 DAYS)   EKG 12-Lead   EKG   Meds ordered this encounter  Medications   metoprolol tartrate (LOPRESSOR) 25 MG tablet    Sig: Take 1 tablet (25 mg total) by mouth 4 (four) times daily as needed (  palpitations).    Dispense:  30 tablet    Refill:  11   Patient Instructions  Medication Instructions:  USE METOPROLOL 4 TIMES A DAY AS  NEEDED FOR PALPITATIONS   *If you need a refill on your cardiac medications before your next appointment, please call your pharmacy*  Lab Work: NONE  Testing/Procedures: 14 DAY MONITOR   Follow-Up: At [facility], you and your health needs are our priority.  As part of our continuing mission to provide you with exceptional heart care, we have created designated Provider Care Teams.  These Care Teams include your primary Cardiologist (physician) and Advanced Practice Providers (APPs -  Physician Assistants and Nurse Practitioners) who all work together to provide you with the care you need, when you need it.  We recommend signing up for the patient portal called "MyChart".  Sign up information is provided on this After Visit Summary.  MyChart is used to connect with patients for Virtual Visits (Telemedicine).  Patients are able to view lab/test results, encounter notes, upcoming appointments, etc.  Non-urgent messages can be sent to your provider as well.   To learn more about what you can do with MyChart, go to [email or web address].    Your next appointment:   4-6 week(s)  The format for your next appointment:   In Person  Provider:   Buford Dresser, MD    Other Instructions Bryn Gulling- Long Term Monitor Instructions  Your physician has requested you wear a ZIO patch monitor for 14 days.  This is a single patch monitor. [employer] supplies one patch monitor per enrollment. Additional stickers are not available. Please do not apply patch if you will be having a  Nuclear Stress Test,  Echocardiogram, Cardiac CT, MRI, or Chest Xray during the period you would be wearing the  monitor. The patch cannot be worn during these tests. You cannot remove and re-apply the  ZIO XT patch monitor.  Your ZIO patch monitor will be mailed 3 day USPS to your address on file. It may take 3-5 days  to receive your monitor after you have been enrolled.  Once you have received your monitor, please review the enclosed instructions. Your monitor  has already been registered assigning a specific monitor serial # to you.  Billing and Patient Assistance Program Information  We have supplied [employer] with any of your insurance information on file for billing purposes. [employer] offers a sliding scale Patient Assistance Program for patients that do not have  insurance, or whose insurance does not completely cover the cost of the ZIO monitor.  You must apply for the Patient Assistance Program to qualify for this discounted rate.  To apply, please call [employer] at [phone], select option 4, select option 2, ask to apply for  Patient Assistance Program. [name] will ask your household income, and how many people  are in your household. They will quote your out-of-pocket cost based on that information.  [employer] will also be able to set up a 63-month interest-free payment plan if needed.  Applying the monitor   Shave hair from upper left chest.  Hold abrader disc by orange tab. Rub abrader in 40 strokes over the upper left chest as  indicated in your monitor instructions.  Clean area with 4 enclosed alcohol pads. Let dry.  Apply patch as indicated in monitor instructions. Patch will be placed under collarbone on left  side of chest with arrow pointing upward.  Rub patch adhesive wings for 2 minutes. Remove white label  marked "1". Remove the white  label marked "2". Rub patch adhesive wings for 2 additional minutes.  While looking in a mirror, press and release button in center  of patch. A small green light will  flash 3-4 times. This will be your only indicator that the monitor has been turned on.  Do not shower for the first 24 hours. You may shower after the first 24 hours.  Press the button if you feel a symptom. You will hear a small click. Record Date, Time and  Symptom in the Patient Logbook.  When you are ready to remove the patch, follow instructions on the last 2 pages of Patient  Logbook. Stick patch monitor onto the last page of Patient Logbook.  Place Patient Logbook in the blue and white box. Use locking tab on box and tape box closed  securely. The blue and white box has prepaid postage on it. Please place it in the mailbox as  soon as possible. Your physician should have your test results approximately 7 days after the  monitor has been mailed back to [facility].  Call [facility] at [phone] if you have questions regarding  your ZIO XT patch monitor. Call them immediately if you see an orange light blinking on your  monitor.  If your monitor falls off in less than 4 days, contact our Monitor department at [phone].  If your monitor becomes loose or falls off after 4 days call [employer] at [phone] for  suggestions on securing your monitor        I,Mathew Stumpf,acting as a scribe for Buford Dresser, MD.,have documented all relevant documentation on the behalf of Buford Dresser, MD,as directed by  Buford Dresser, MD while in the presence of Buford Dresser, MD.   I, Buford Dresser, MD, have reviewed all documentation for this visit. The documentation on 01/21/23 for the exam, diagnosis, procedures, and orders are all accurate and complete.   Signed, Buford Dresser, MD PhD 12/01/2022     Devon Griffith

## 2022-12-01 NOTE — ED Notes (Signed)
Pt called for triage. Requested for [place] to call him again in 37m

## 2022-12-01 NOTE — Patient Instructions (Addendum)
Medication Instructions:  USE METOPROLOL 4 TIMES A DAY AS  NEEDED FOR PALPITATIONS   *If you need a refill on your cardiac medications before your next appointment, please call your pharmacy*  Lab Work: NONE  Testing/Procedures: 14 DAY MONITOR   Follow-Up: At [facility], you and your health needs are our priority.  As part of our continuing mission to provide you with exceptional heart care, we have created designated Provider Care Teams.  These Care Teams include your primary Cardiologist (physician) and Advanced Practice Providers (APPs -  Physician Assistants and Nurse Practitioners) who all work together to provide you with the care you need, when you need it.  We recommend signing up for the patient portal called "MyChart".  Sign up information is provided on this After Visit Summary.  MyChart is used to connect with patients for Virtual Visits (Telemedicine).  Patients are able to view lab/test results, encounter notes, upcoming appointments, etc.  Non-urgent messages can be sent to your provider as well.   To learn more about what you can do with MyChart, go to [email or web address].    Your next appointment:   4-6 week(s)  The format for your next appointment:   In Person  Provider:   Buford Dresser, MD    Other Instructions Devon Griffith- Long Term Monitor Instructions  Your physician has requested you wear a ZIO patch monitor for 14 days.  This is a single patch monitor. [employer] supplies one patch monitor per enrollment. Additional stickers are not available. Please do not apply patch if you will be having a Nuclear Stress Test,  Echocardiogram, Cardiac CT, MRI, or Chest Xray during the period you would be wearing the  monitor. The patch cannot be worn during these tests. You cannot remove and re-apply the  ZIO XT patch monitor.  Your ZIO patch monitor will be mailed 3 day USPS to your address on file. It may take 3-5 days  to receive your monitor after you  have been enrolled.  Once you have received your monitor, please review the enclosed instructions. Your monitor  has already been registered assigning a specific monitor serial # to you.  Billing and Patient Assistance Program Information  We have supplied [employer] with any of your insurance information on file for billing purposes. [employer] offers a sliding scale Patient Assistance Program for patients that do not have  insurance, or whose insurance does not completely cover the cost of the ZIO monitor.  You must apply for the Patient Assistance Program to qualify for this discounted rate.  To apply, please call [employer] at [phone], select option 4, select option 2, ask to apply for  Patient Assistance Program. [name] will ask your household income, and how many people  are in your household. They will quote your out-of-pocket cost based on that information.  [employer] will also be able to set up a 70-month interest-free payment plan if needed.  Applying the monitor   Shave hair from upper left chest.  Hold abrader disc by orange tab. Rub abrader in 40 strokes over the upper left chest as  indicated in your monitor instructions.  Clean area with 4 enclosed alcohol pads. Let dry.  Apply patch as indicated in monitor instructions. Patch will be placed under collarbone on left  side of chest with arrow pointing upward.  Rub patch adhesive wings for 2 minutes. Remove white label marked "1". Remove the white  label marked "2". Rub patch adhesive wings for 2 additional minutes.  While  looking in a mirror, press and release button in center of patch. A small green light will  flash 3-4 times. This will be your only indicator that the monitor has been turned on.  Do not shower for the first 24 hours. You may shower after the first 24 hours.  Press the button if you feel a symptom. You will hear a small click. Record Date, Time and  Symptom in the Patient Logbook.  When you are ready to  remove the patch, follow instructions on the last 2 pages of Patient  Logbook. Stick patch monitor onto the last page of Patient Logbook.  Place Patient Logbook in the blue and white box. Use locking tab on box and tape box closed  securely. The blue and white box has prepaid postage on it. Please place it in the mailbox as  soon as possible. Your physician should have your test results approximately 7 days after the  monitor has been mailed back to [facility].  Call [place] at [phone] if you have questions regarding  your ZIO XT patch monitor. Call them immediately if you see an orange light blinking on your  monitor.  If your monitor falls off in less than 4 days, contact our Monitor department at [phone].  If your monitor becomes loose or falls off after 4 days call [employer] at [phone] for  suggestions on securing your monitor

## 2022-12-27 ENCOUNTER — Telehealth: Payer: Self-pay | Admitting: Cardiology

## 2022-12-27 NOTE — Telephone Encounter (Signed)
Devon Griffith from [employer] calling with abnormal results

## 2022-12-27 NOTE — Telephone Encounter (Signed)
Received call from [facility] regarding monitor he wore from 12/15-12/29 Patient had symptomatic SVT x 3 fastest HR 218 lasting 1 min 20 seconds, longest 1 min 30 sec average HR 150  Will forward to Dr Harrell Gave for review

## 2022-12-29 ENCOUNTER — Ambulatory Visit (INDEPENDENT_AMBULATORY_CARE_PROVIDER_SITE_OTHER): Payer: BC Managed Care – PPO | Admitting: Cardiology

## 2022-12-29 ENCOUNTER — Encounter (HOSPITAL_BASED_OUTPATIENT_CLINIC_OR_DEPARTMENT_OTHER): Payer: Self-pay | Admitting: Cardiology

## 2022-12-29 VITALS — BP 138/90 | HR 76 | Ht 71.0 in | Wt 275.0 lb

## 2022-12-29 DIAGNOSIS — R002 Palpitations: Secondary | ICD-10-CM | POA: Diagnosis not present

## 2022-12-29 DIAGNOSIS — G4733 Obstructive sleep apnea (adult) (pediatric): Secondary | ICD-10-CM

## 2022-12-29 DIAGNOSIS — Z6838 Body mass index (BMI) 38.0-38.9, adult: Secondary | ICD-10-CM

## 2022-12-29 DIAGNOSIS — I10 Essential (primary) hypertension: Secondary | ICD-10-CM | POA: Diagnosis not present

## 2022-12-29 DIAGNOSIS — I471 Supraventricular tachycardia, unspecified: Secondary | ICD-10-CM | POA: Diagnosis not present

## 2022-12-29 NOTE — Telephone Encounter (Signed)
December 29, 2022 Buford Dresser, MD  to Me     12/29/22  9:49 AM  Discussed full monitor at visit today

## 2022-12-29 NOTE — Progress Notes (Signed)
Cardiology Office Note:    Date:  12/29/2022   ID:  Devon Griffith, DOB 04-07-1961, MRN [id number]  PCP:  [facility] [place]  Cardiologist:  Buford Dresser, MD  Referring MD: [facility]*   CC: follow-up   History of Present Illness:    Devon Griffith is a 62 y.o. male with a hx of pSVT, hyperlipidemia, hypertension, GERD, obstructive sleep apnea on CPAP, kidney stones who is seen for follow-up. He was previously followed by Dr. Claiborne Billings.   Cardiac history:  A nuclear perfusion study in 2014 revealed normal perfusion without scar or ischemia. An echo Doppler study confirmed systolic ejection fraction at 60-65% and he had grade 1 diastolic dysfunction. A follow-up echo Doppler study subsequent to his Covid infection on January 28, 2020  normal EF at 55 to 60%, mild LVH, grade 2 diastolic dysfunction.   Today, he states that he has been feeling well overall. He states that he has had fewer episodes of rapid palpitations since his last visit. He has been working out 4 days a week and walking the other days of the week for the last several weeks and has no CV symptoms with this. He is frustrated as he has had difficulty losing weight. He discussed the [employer] with a dietician and has the resources to start this diet. He states that he lost 50lbs in the past with extreme caloric deficit dieting and exercise in the past.  We reviewed his cardiac monitor results revealing 3 episodes of SVT. We also discussed interventions for prolonged episodes of SVT.  Patient states that he is drinking alcohol on the weekends and has cut back on his alcohol intake overall. This weekend he had 2 cocktails and 1 beer on Friday, 3 beers Saturday, and 1 beer and 1 glass of wine Sunday. His wife reports that this typical for him on a weekend but sometimes he may drink more. She agrees that he has cut back on his alcohol use.  Patient's wife notes that he has been  experiencing a persistent cough with productive hard, off-white sputum. His cough is primarily in the mornings. Stress/anxiety trigger his cough. It has improved somewhat since he left a high stress job. His cough began after he had COVID. He has tried Claritin daily without relief. Patient has seen pulmonologist Dr. Sherrilyn Rist after having COVID in 2021 for persistent shortness of breath. He has also been evaluated by Dr. Marijo Sanes in 2022 for this as well.  He would like to follow up with pulmonology for his cough and CPAP management.  The patient denies chest pain, chest pressure, dyspnea at rest or with exertion, PND, orthopnea, or leg swelling. Denies cough, fever, chills, nausea, or vomiting. Denies syncope, presyncope, or snoring. Denies dizziness or lightheadedness.     Past Medical History:  Diagnosis Date   Achilles tendon disorder    Left   Arrhythmia    wore monitor 2-3 weeks.aprox 5 years ago.   GERD (gastroesophageal reflux disease) 04/12/2020   H/O hiatal hernia    hx of   Hyperlipidemia    Hypertension    Kidney stones    Knee pain    Pain in limb    Sleep apnea    CPAP  settings at 10    Urinary frequency     Past Surgical History:  Procedure Laterality Date   ACHILLES TENDON SURGERY Left 05/27/2019   Procedure: Left Achilles tendon reconstruction;  Surgeon: Wylene Simmer,  MD;  Location: [facility];  Service: Orthopedics;  Laterality: Left;   ANKLE SURGERY     EXTRACORPOREAL SHOCK WAVE LITHOTRIPSY Left 10/14/2018   Procedure: LEFT EXTRACORPOREAL SHOCK WAVE LITHOTRIPSY (ESWL);  Surgeon: Ceasar Mons, MD;  Location: [facility];  Service: Urology;  Laterality: Left;   HERNIA REPAIR  2000   KNEE ARTHROSCOPY W/ ACL RECONSTRUCTION  2002   left knee   LITHOTRIPSY  2002   VARICOSE VEIN SURGERY  2013   [facility]    Current Medications: Current Outpatient Medications on File Prior to Visit  Medication Sig   albuterol (VENTOLIN HFA) 108 (90  Base) MCG/ACT inhaler Inhale 2 puffs into the lungs every 6 (six) hours as needed for wheezing or shortness of breath.   aspirin EC 81 MG tablet Take 1 tablet (81 mg total) by mouth 2 (two) times daily.   B Complex Vitamins (B COMPLEX PO) Take 1 tablet by mouth daily.   buPROPion (WELLBUTRIN XL) 300 MG 24 hr tablet Take 300 mg by mouth every morning.   CARTIA XT 180 MG 24 hr capsule Take 1 capsule by mouth daily.   lisinopril (ZESTRIL) 10 MG tablet Take 10 mg by mouth daily.   metoprolol tartrate (LOPRESSOR) 25 MG tablet Take 1 tablet (25 mg total) by mouth 4 (four) times daily as needed (palpitations).   rosuvastatin (CRESTOR) 20 MG tablet Take 1 tablet by mouth once daily   tamsulosin (FLOMAX) 0.4 MG CAPS capsule Take 0.4 mg by mouth daily.   Testosterone 1.62 % GEL Place 20.25 mg onto the skin daily.   VITAMIN D PO Take 1 tablet by mouth daily.   No current facility-administered medications on file prior to visit.     Allergies:   Bupropion   Social History   Tobacco Use   Smoking status: Never   Smokeless tobacco: Never  Vaping Use   Vaping Use: Never used  Substance Use Topics   Alcohol use: Yes    Comment: 10 per week   Drug use: No    Family History: family history includes Bipolar disorder in his brother; Cancer in his mother; Emphysema in his father; Healthy in his son and son; Mental illness in his brother.  ROS:   Please see the history of present illness.   + Palpitations (improved) + Productive cough, with hard clumps of off-white sputum Additional pertinent ROS negative except as documented.   EKGs/Labs/Other Studies Reviewed:    The following studies were reviewed today:  Monitor 12/2022 Patch Wear Time:  13 days and 23 hours Patient had a min HR of 50 bpm, max HR of 218 bpm, and avg HR of 80 bpm. Predominant underlying rhythm was Sinus Rhythm. 3 Supraventricular Tachycardia runs occurred, the run with the fastest interval lasting 1 min 20 secs with a max  rate of 218 bpm, the longest lasting 1 min 30 secs with an avg rate of 150 bpm. No VT, atrial fibrillation, high degree block, or pauses noted. Isolated atrial and ventricular ectopy was rare (<1%). There were 5 triggered events. Four were sinus, one was SVT.  ECHO 01/28/2020:  1. Left ventricular ejection fraction, by estimation, is 55 to 60%. The  left ventricle has normal function. The left ventrical has no regional  wall motion abnormalities. There is mildly increased left ventricular  hypertrophy. Left ventricular diastolic  parameters are consistent with Grade II diastolic dysfunction  (pseudonormalization).   2. Right ventricular systolic function is normal. The right ventricular  size is normal. There is normal pulmonary artery systolic pressure. The  estimated right ventricular systolic pressure is 19.3 mmHg.   3. The mitral valve is normal in structure and function. no evidence of  mitral valve regurgitation. No evidence of mitral stenosis.   4. The aortic valve is abnormal. Aortic valve regurgitation is not  visualized. Mild aortic valve sclerosis is present, with no evidence of  aortic valve stenosis.   5. Aortic dilatation noted. 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   6. The inferior vena cava is normal in size with greater than 50%  respiratory variability, suggesting right atrial pressure of 3 mmHg.   CTA Chest 11/24/2018: IMPRESSION: 1. No pulmonary embolus or other acute thoracic abnormality. 2. 5 mm subpleural right middle lobe nodule. No follow-up needed if patient is low-risk. Non-contrast chest CT can be considered in 12 months if patient is high-risk. This recommendation follows the consensus statement: Guidelines for Management of Incidental Pulmonary Nodules Detected on CT Images: From the Fleischner Society 2017; Radiology 2017; 284:228-243.  EKG:  EKG is personally reviewed.   No new today   Recent Labs: No results found for  requested labs within last 365 days.   Recent Lipid Panel    Component Value Date/Time   CHOL 138 01/20/2015 1522   CHOL 198 05/09/2013 1130   TRIG 155 (H) 01/20/2015 1522   TRIG 169 (H) 05/09/2013 1130   HDL 42 01/20/2015 1522   HDL 40 05/09/2013 1130   CHOLHDL 3.3 01/20/2015 1522   VLDL 31 01/20/2015 1522   LDLCALC 65 01/20/2015 1522   LDLCALC 124 (H) 05/09/2013 1130    Physical Exam:    VS:  BP (!) 138/90   Pulse 76   Ht '5\' 11"'$  (1.803 m)   Wt 275 lb (124.7 kg)   BMI 38.35 kg/m     Wt Readings from Last 3 Encounters:  12/29/22 275 lb (124.7 kg)  12/01/22 271 lb 12.8 oz (123.3 kg)  01/26/21 280 lb (127 kg)    GEN: Well nourished, well developed in no acute distress HEENT: Normal, moist mucous membranes NECK: No JVD CARDIAC: regular rhythm, normal S1 and S2, no rubs or gallops. No murmur. VASCULAR: Radial and DP pulses 2+ bilaterally. No carotid bruits RESPIRATORY:  Clear to auscultation without rales, wheezing or rhonchi  ABDOMEN: Soft, non-tender, non-distended MUSCULOSKELETAL:  Ambulates independently SKIN: Warm and dry, no edema NEUROLOGIC:  Alert and oriented x 3. No focal neuro deficits noted. PSYCHIATRIC:  Normal affect    ASSESSMENT:    1. Heart palpitations   2. Paroxysmal SVT (supraventricular tachycardia)   3. OSA (obstructive sleep apnea)   4. Essential hypertension   5. Class 2 severe obesity due to excess calories with serious comorbidity and body mass index (BMI) of 38.0 to 38.9 in adult [facility])     PLAN:    Paroxysmal SVT Palpitations -monitor reviewed. 3 brief episodes of SVT, all <2 min. 5 symptomatic events, 4 of which were sinus and one was SVT -continue metoprolol PRN, diltiazem daily.  Hyperlipidemia -continue rosuvastatin '20mg'$  daily -on aspirin '81mg'$  BID per patient preference, we have discussed recommendations and guidelines  Hypertension -continue lisinopril '10mg'$  daily  OSA on CPAP -continue  Cardiac risk counseling and  prevention recommendations: -recommend heart healthy/Mediterranean diet, with whole grains, fruits, vegetable, fish, lean meats, nuts, and olive oil. Limit salt. -recommend moderate walking, 3-5 times/week for 30-50 minutes each session. Aim for at least 150 minutes.week.  Goal should be pace of 3 miles/hours, or walking 1.5 miles in 30 minutes -recommend avoidance of tobacco products. Avoid excess alcohol. -ASCVD risk score: The ASCVD Risk score (Arnett DK, et al., 2019) failed to calculate for the following reasons:   Cannot find a previous HDL lab   Cannot find a previous total cholesterol lab    Plan for follow up: 1 year  Buford Dresser, MD, PhD, [facility]    Medication Adjustments/Labs and Tests Ordered: Current medicines are reviewed at length with the patient today.  Concerns regarding medicines are outlined above.  No orders of the defined types were placed in this encounter.  No orders of the defined types were placed in this encounter.  Patient Instructions  Medication Instructions:  Your Physician recommend you continue on your current medication as directed.    *If you need a refill on your cardiac medications before your next appointment, please call your pharmacy*  Follow-Up: At [facility], you and your health needs are our priority.  As part of our continuing mission to provide you with exceptional heart care, we have created designated Provider Care Teams.  These Care Teams include your primary Cardiologist (physician) and Advanced Practice Providers (APPs -  Physician Assistants and Nurse Practitioners) who all work together to provide you with the care you need, when you need it.  We recommend signing up for the patient portal called "MyChart".  Sign up information is provided on this After Visit Summary.  MyChart is used to connect with patients for Virtual Visits (Telemedicine).  Patients are able to view lab/test results,  encounter notes, upcoming appointments, etc.  Non-urgent messages can be sent to your provider as well.   To learn more about what you can do with MyChart, go to [email or web address].    Your next appointment:   1 year(s)  Provider:   Buford Dresser, MD    Other Instructions If you have a long episode of the palpitations, here is what I would tell EMS: you have a known history of SVT. You are on diltiazem every day at baseline, and you have also take (x number of) doses of as needed metoprolol and the racing has continued (for how long). You were told that if it is a severe episode that hasn't improved with medication or maneuvers, you should call EMS and ask if they can break it with adenosine.  Vagal maneuvers are other ways to try to manage SVT. Here is some information for the [facility]: What are vagal maneuvers? Vagal maneuvers are physical actions that make your vagus nerve act on your heart's natural pacemaker, slowing down its electrical impulses. Your vagus nerve -- which goes from your brainstem to your belly -- plays a major role in your parasympathetic nervous system, which controls a number of things in your body, including heart rate.  Healthcare providers can do vagal maneuvers when it makes sense for a person with a fast heart rate. Don't try these yourself without talking to your healthcare provider first.  Types of vagal maneuvers Healthcare providers often use these:  Valsalva maneuver (bearing down like you're having a bowel movement (pooping). See below). Diving reflex. Carotid sinus massage. Gag reflex. Coughing. Handstand for 30 seconds. (In one study, healthcare providers taught parents how to help their kids do this.) Applied abdominal pressure. (Try lying on your back and folding your lower body toward your face until your feet are past your head. Take a breath and  strain for 20 to 30 seconds.) Why are vagal maneuvers used? Vagal maneuvers are a  first-line (first choice) treatment for supraventricular tachycardia (SVT) (fast heart rate) because they're a low-risk, low-cost way to slow down a heart rate that's too fast. They can have a 20% to 40% success rate for getting certain fast heart rhythms (more than 100 beats a minute) back to normal rhythms.  Vagal maneuvers can also help your healthcare provider diagnose which type of arrhythmia (irregular or abnormal beat) you have, as certain types of heart rhythm disorders classically respond to this maneuver.  Who shouldn't have vagal maneuvers? Your healthcare provider will only use vagal maneuvers if you're considered stable. They won't do vagal maneuvers if you're unstable, meaning you have:  Low blood pressure. Chest pain. Shortness of breath. A shortage of oxygen in your body. An inability to get enough blood to your organs. If you're unstable, your healthcare provider will do cardioversion (using medicine or an electrical shock) instead of vagal maneuvers. Anyone who's feeling unwell should go to an emergency room or call 911 immediately.  How commonly are vagal maneuvers used? Supraventricular tachycardia (SVT) is common in adults and children, and is the most common heart rhythm abnormality in children. An estimated 1 in 250 to 1 in 1,000 children have SVT. Since vagal maneuvers are the first treatment choice for SVT, they're commonly used.  Procedure Details What happens before vagal maneuvers? Your healthcare provider will do an electrocardiogram (EKG) to check your heart rhythm. They'll monitor your heart rate, blood pressure and oxygen level.  What happens during vagal maneuvers? Here's how healthcare providers do the three most common vagal maneuvers:  Diving reflex While sitting, you'll take several deep breaths, hold your breath and then quickly put your whole face into a container of ice water. Keep your face submerged as long as you can.  The alternative approach is  putting a bag of ice water or an ice-cold, wet towel against your face.  Valsalva maneuver While lying on your back, take a deep breath and act like you're exhaling but with your nose and mouth closed for 10 to 30 seconds. It should feel like trying to breathe air out into a blocked straw.  In a modified version of this maneuver (which can work better than the original method), you can do this while sitting up and then have your healthcare provider quickly drop the part of the bed supporting your upper body.  When they lower your bed, they bring your knees to your chest or put your legs in the air. Keep your legs in that position 30 to 45 seconds longer than holding your breath.  Another Valsalva technique healthcare providers use for kids is to have them blow on their thumb without letting any air out.  Carotid sinus massage You'll lie on your back with your head turned to one side. Your healthcare provider will use their fingers to push on your carotid sinus for five to 10 seconds. If it doesn't work, they can try again after a minute or try the other side of your neck.  What happens after vagal maneuvers? Hopefully, the arrhythmia (irregular or abnormal beat) resolves. Your healthcare provider will do another electrocardiogram (EKG) to see if the vagal maneuver was successful at bringing your heart rhythm back to normal. If they try vagal maneuvers two or three times and they don't work, they can give you medication to treat your arrhythmia. Medical or electrical cardioversion is another treatment option.  If vagal maneuvers don't work, your healthcare provider may contact a cardiologist (heart specialist) to evaluate you.    I,Alexis Herring,acting as a Education administrator for [employer], MD.,have documented all relevant documentation on the behalf of Buford Dresser, MD,as directed by  Buford Dresser, MD while in the presence of Buford Dresser, MD.  I, Buford Dresser, MD, have reviewed all documentation for this visit. The documentation on 12/29/22 for the exam, diagnosis, procedures, and orders are all accurate and complete.   Signed, Buford Dresser, MD PhD 12/29/2022 10:09 AM    [place]

## 2022-12-29 NOTE — Patient Instructions (Addendum)
Medication Instructions:  Your Physician recommend you continue on your current medication as directed.    *If you need a refill on your cardiac medications before your next appointment, please call your pharmacy*  Follow-Up: At [facility], you and your health needs are our priority.  As part of our continuing mission to provide you with exceptional heart care, we have created designated Provider Care Teams.  These Care Teams include your primary Cardiologist (physician) and Advanced Practice Providers (APPs -  Physician Assistants and Nurse Practitioners) who all work together to provide you with the care you need, when you need it.  We recommend signing up for the patient portal called "MyChart".  Sign up information is provided on this After Visit Summary.  MyChart is used to connect with patients for Virtual Visits (Telemedicine).  Patients are able to view lab/test results, encounter notes, upcoming appointments, etc.  Non-urgent messages can be sent to your provider as well.   To learn more about what you can do with MyChart, go to [email or web address].    Your next appointment:   1 year(s)  Provider:   Buford Dresser, MD    Other Instructions If you have a long episode of the palpitations, here is what I would tell EMS: you have a known history of SVT. You are on diltiazem every day at baseline, and you have also take (x number of) doses of as needed metoprolol and the racing has continued (for how long). You were told that if it is a severe episode that hasn't improved with medication or maneuvers, you should call EMS and ask if they can break it with adenosine.  Vagal maneuvers are other ways to try to manage SVT. Here is some information for the [facility]: What are vagal maneuvers? Vagal maneuvers are physical actions that make your vagus nerve act on your heart's natural pacemaker, slowing down its electrical impulses. Your vagus nerve -- which goes from  your brainstem to your belly -- plays a major role in your parasympathetic nervous system, which controls a number of things in your body, including heart rate.  Healthcare providers can do vagal maneuvers when it makes sense for a person with a fast heart rate. Don't try these yourself without talking to your healthcare provider first.  Types of vagal maneuvers Healthcare providers often use these:  Valsalva maneuver (bearing down like you're having a bowel movement (pooping). See below). Diving reflex. Carotid sinus massage. Gag reflex. Coughing. Handstand for 30 seconds. (In one study, healthcare providers taught parents how to help their kids do this.) Applied abdominal pressure. (Try lying on your back and folding your lower body toward your face until your feet are past your head. Take a breath and strain for 20 to 30 seconds.) Why are vagal maneuvers used? Vagal maneuvers are a first-line (first choice) treatment for supraventricular tachycardia (SVT) (fast heart rate) because they're a low-risk, low-cost way to slow down a heart rate that's too fast. They can have a 20% to 40% success rate for getting certain fast heart rhythms (more than 100 beats a minute) back to normal rhythms.  Vagal maneuvers can also help your healthcare provider diagnose which type of arrhythmia (irregular or abnormal beat) you have, as certain types of heart rhythm disorders classically respond to this maneuver.  Who shouldn't have vagal maneuvers? Your healthcare provider will only use vagal maneuvers if you're considered stable. They won't do vagal maneuvers if you're unstable, meaning you have:  Low blood pressure.  Chest pain. Shortness of breath. A shortage of oxygen in your body. An inability to get enough blood to your organs. If you're unstable, your healthcare provider will do cardioversion (using medicine or an electrical shock) instead of vagal maneuvers. Anyone who's feeling unwell should go to  an emergency room or call 911 immediately.  How commonly are vagal maneuvers used? Supraventricular tachycardia (SVT) is common in adults and children, and is the most common heart rhythm abnormality in children. An estimated 1 in 250 to 1 in 1,000 children have SVT. Since vagal maneuvers are the first treatment choice for SVT, they're commonly used.  Procedure Details What happens before vagal maneuvers? Your healthcare provider will do an electrocardiogram (EKG) to check your heart rhythm. They'll monitor your heart rate, blood pressure and oxygen level.  What happens during vagal maneuvers? Here's how healthcare providers do the three most common vagal maneuvers:  Diving reflex While sitting, you'll take several deep breaths, hold your breath and then quickly put your whole face into a container of ice water. Keep your face submerged as long as you can.  The alternative approach is putting a bag of ice water or an ice-cold, wet towel against your face.  Valsalva maneuver While lying on your back, take a deep breath and act like you're exhaling but with your nose and mouth closed for 10 to 30 seconds. It should feel like trying to breathe air out into a blocked straw.  In a modified version of this maneuver (which can work better than the original method), you can do this while sitting up and then have your healthcare provider quickly drop the part of the bed supporting your upper body.  When they lower your bed, they bring your knees to your chest or put your legs in the air. Keep your legs in that position 30 to 45 seconds longer than holding your breath.  Another Valsalva technique healthcare providers use for kids is to have them blow on their thumb without letting any air out.  Carotid sinus massage You'll lie on your back with your head turned to one side. Your healthcare provider will use their fingers to push on your carotid sinus for five to 10 seconds. If it doesn't work, they  can try again after a minute or try the other side of your neck.  What happens after vagal maneuvers? Hopefully, the arrhythmia (irregular or abnormal beat) resolves. Your healthcare provider will do another electrocardiogram (EKG) to see if the vagal maneuver was successful at bringing your heart rhythm back to normal. If they try vagal maneuvers two or three times and they don't work, they can give you medication to treat your arrhythmia. Medical or electrical cardioversion is another treatment option.  If vagal maneuvers don't work, your healthcare provider may contact a cardiologist (heart specialist) to evaluate you.

## 2023-01-21 ENCOUNTER — Encounter (HOSPITAL_BASED_OUTPATIENT_CLINIC_OR_DEPARTMENT_OTHER): Payer: Self-pay | Admitting: Cardiology

## 2023-08-15 ENCOUNTER — Encounter: Payer: Self-pay | Admitting: Pulmonary Disease

## 2023-08-15 ENCOUNTER — Ambulatory Visit: Payer: BC Managed Care – PPO | Admitting: Pulmonary Disease

## 2023-08-15 VITALS — BP 110/80 | HR 56 | Ht 71.0 in | Wt 278.8 lb

## 2023-08-15 DIAGNOSIS — R053 Chronic cough: Secondary | ICD-10-CM

## 2023-08-15 DIAGNOSIS — K219 Gastro-esophageal reflux disease without esophagitis: Secondary | ICD-10-CM

## 2023-08-15 MED ORDER — OMEPRAZOLE 20 MG PO CPDR: 20.0000 mg | DELAYED_RELEASE_CAPSULE | Freq: Every day | ORAL | 11 refills | Status: DC

## 2023-08-15 NOTE — Progress Notes (Signed)
Synopsis: Referred in Aug 2024 for cough by [facility]*  Subjective:   PATIENT ID: Devon Griffith GENDER: male DOB: 1961-02-10, MRN: [id number]  Chief Complaint  Patient presents with   Consult    Consult on coughing.    This is a 62 year old gentleman, past medical history of hyperlipidemia, hypertension, obesity.  Referred for ongoing cough.  This has been going on for years.  He has gastroesophageal reflux does not take anything for this.  He has sleep apnea and rarely wears a CPAP mask.  He also snacks late into the evening.  Usually wakes up in the middle the night with cough.    Past Medical History:  Diagnosis Date   Achilles tendon disorder    Left   Arrhythmia    wore monitor 2-3 weeks.aprox 5 years ago.   GERD (gastroesophageal reflux disease) 04/12/2020   H/O hiatal hernia    hx of   Hyperlipidemia    Hypertension    Kidney stones    Knee pain    Pain in limb    Sleep apnea    CPAP  settings at 10    Urinary frequency      Family History  Problem Relation Age of Onset   Cancer Mother    Emphysema Father    Bipolar disorder Brother    Mental illness Brother    Healthy Son    Healthy Son      Past Surgical History:  Procedure Laterality Date   ACHILLES TENDON SURGERY Left 05/27/2019   Procedure: Left Achilles tendon reconstruction;  Surgeon: Toni Arthurs, MD;  Location: [facility] OR;  Service: Orthopedics;  Laterality: Left;   ANKLE SURGERY     EXTRACORPOREAL SHOCK WAVE LITHOTRIPSY Left 10/14/2018   Procedure: LEFT EXTRACORPOREAL SHOCK WAVE LITHOTRIPSY (ESWL);  Surgeon: Rene Paci, MD;  Location: [facility] ORS;  Service: Urology;  Laterality: Left;   HERNIA REPAIR  2000   KNEE ARTHROSCOPY W/ ACL RECONSTRUCTION  2002   left knee   LITHOTRIPSY  2002   [facility] VEIN [facility]GERY  [facility]    Social History   Socioeconomic History   Marital status: Married    Spouse name: Not on file   Number of children: Not on  file   Years of education: Not on file   Highest education level: Not on file  Occupational History   Not on file  Tobacco Use   Smoking status: Never   Smokeless tobacco: Never  Vaping Use   Vaping status: Never Used  Substance and Sexual Activity   Alcohol use: Yes    Comment: 10 per week   Drug use: No   Sexual activity: Not on file  Other Topics Concern   Not on file  Social History Narrative   Not on file   Social Determinants of Health   Financial Resource Strain: Not on file  Food Insecurity: Not on file  Transportation Needs: Not on file  Physical Activity: Not on file  Stress: Not on file  Social Connections: Not on file  Intimate Partner Violence: Not on file     Allergies  Allergen Reactions   Bupropion     Other reaction(s): anxiety     Outpatient Medications Prior to Visit  Medication Sig Dispense Refill   albuterol (VENTOLIN HFA) 108 (90 Base) MCG/ACT inhaler Inhale 2 puffs into the lungs every 6 (six) hours as needed for wheezing or shortness of breath. 18 g 5  aspirin EC 81 MG tablet Take 1 tablet (81 mg total) by mouth 2 (two) times daily. 90 tablet 0   B Complex Vitamins (B COMPLEX PO) Take 1 tablet by mouth daily.     buPROPion (WELLBUTRIN XL) 300 MG 24 hr tablet Take 300 mg by mouth every morning.     CARTIA XT 180 MG 24 hr capsule Take 1 capsule by mouth daily.     lisinopril (ZESTRIL) 10 MG tablet Take 10 mg by mouth daily.     rosuvastatin (CRESTOR) 20 MG tablet Take 1 tablet by mouth once daily 90 tablet 0   tamsulosin (FLOMAX) 0.4 MG CAPS capsule Take 0.4 mg by mouth daily.     Testosterone 1.62 % GEL Place 20.25 mg onto the skin daily.     VITAMIN D PO Take 1 tablet by mouth daily.     metoprolol tartrate (LOPRESSOR) 25 MG tablet Take 1 tablet (25 mg total) by mouth 4 (four) times daily as needed (palpitations). 30 tablet 11   No facility-administered medications prior to visit.    Review of Systems  Constitutional:  Negative for  chills, fever, malaise/fatigue and weight loss.  HENT:  Negative for hearing loss, sore throat and tinnitus.   Eyes:  Negative for blurred vision and double vision.  Respiratory:  Positive for cough. Negative for hemoptysis, sputum production, shortness of breath, wheezing and stridor.   Cardiovascular:  Negative for chest pain, palpitations, orthopnea, leg swelling and PND.  Gastrointestinal:  Negative for abdominal pain, constipation, diarrhea, heartburn, nausea and vomiting.  Genitourinary:  Negative for dysuria, hematuria and urgency.  Musculoskeletal:  Negative for joint pain and myalgias.  Skin:  Negative for itching and rash.  Neurological:  Negative for dizziness, tingling, weakness and headaches.  Endo/Heme/Allergies:  Negative for environmental allergies. Does not bruise/bleed easily.  Psychiatric/Behavioral:  Negative for depression. The patient is not nervous/anxious and does not have insomnia.   All other systems reviewed and are negative.    Objective:  Physical Exam Vitals reviewed.  Constitutional:      General: He is not in acute distress.    Appearance: He is well-developed. He is obese.  HENT:     Head: Normocephalic and atraumatic.  Eyes:     General: No scleral icterus.    Conjunctiva/sclera: Conjunctivae normal.     Pupils: Pupils are equal, round, and reactive to light.  Neck:     Vascular: No JVD.     Trachea: No tracheal deviation.  Cardiovascular:     Rate and Rhythm: Normal rate and regular rhythm.     Heart sounds: Normal heart sounds. No murmur heard. Pulmonary:     Effort: Pulmonary effort is normal. No tachypnea, accessory muscle usage or respiratory distress.     Breath sounds: No stridor. No wheezing, rhonchi or rales.  Abdominal:     General: There is no distension.     Palpations: Abdomen is soft.     Tenderness: There is no abdominal tenderness.  Musculoskeletal:        General: No tenderness.     Cervical back: Neck supple.   Lymphadenopathy:     Cervical: No cervical adenopathy.  Skin:    General: Skin is warm and dry.     Capillary Refill: Capillary refill takes less than 2 seconds.     Findings: No rash.  Neurological:     Mental Status: He is alert and oriented to person, place, and time.  Psychiatric:  Behavior: Behavior normal.      Vitals:   08/15/23 1430  BP: 110/80  Pulse: (!) 56  SpO2: 94%  Weight: 278 lb 12.8 oz (126.5 kg)  Height: 5\' 11"  (1.803 m)   94% on RA BMI Readings from Last 3 Encounters:  08/15/23 38.88 kg/m  12/29/22 38.35 kg/m  12/01/22 37.91 kg/m   Wt Readings from Last 3 Encounters:  08/15/23 278 lb 12.8 oz (126.5 kg)  12/29/22 275 lb (124.7 kg)  12/01/22 271 lb 12.8 oz (123.3 kg)     CBC    Component Value Date/Time   WBC 6.6 05/27/2019 0603   RBC 4.91 05/27/2019 0603   HGB 15.0 05/27/2019 0603   HCT 43.5 05/27/2019 0603   PLT 209 05/27/2019 0603   MCV 88.6 05/27/2019 0603   MCH 30.5 05/27/2019 0603   MCHC 34.5 05/27/2019 0603   RDW 12.5 05/27/2019 0603   LYMPHSABS 2.1 09/24/2018 1720   MONOABS 0.7 09/24/2018 1720   EOSABS 0.0 09/24/2018 1720   BASOSABS 0.0 09/24/2018 1720     Chest Imaging: Chest x-ray 2021: Clear, no infiltrate. The patient's images have been independently reviewed by me.    Pulmonary Functions Testing Results:    Latest Ref Rng & Units 04/09/2020   12:05 PM  PFT Results  FVC-Pre L 4.33   FVC-Predicted Pre % 90   FVC-Post L 4.17   FVC-Predicted Post % 87   Pre FEV1/FVC % % 84   Post FEV1/FCV % % 90   FEV1-Pre L 3.65   FEV1-Predicted Pre % 101   FEV1-Post L 3.73   DLCO uncorrected ml/min/mmHg 24.89   DLCO UNC% % 90   DLCO corrected ml/min/mmHg 24.89   DLCO COR %Predicted % 90   DLVA Predicted % 98   TLC L 6.06   TLC % Predicted % 86   RV % Predicted % 72     FeNO:   Pathology:   Echocardiogram:   Heart Catheterization:     Assessment & Plan:     ICD-10-CM   1. Chronic cough  R05.3     2.  Gastroesophageal reflux disease without esophagitis  K21.9       Discussion:  This is a 62 year old gentleman seen today for chronic cough.  He has obstructive sleep apnea that he does not treat regularly with his CPAP mask as he does not wear it.  He "occasionally" wears his mask.  He has gastroesophageal reflux does not take anything for this and he routinely snacks late into the evening.  Plan: He needs to not eat anything at minimum 4 hours before he goes to sleep. He needs to take reflux medication to treat his reflux because I am assuming that his cough is likely related to this. And he needs to lose weight.  We talked about referral to medical weight loss clinic which I think would be pertinent and potentially beneficial to him. He also needs to treat his obstructive sleep apnea.    Current Outpatient Medications:    albuterol (VENTOLIN HFA) 108 (90 Base) MCG/ACT inhaler, Inhale 2 puffs into the lungs every 6 (six) hours as needed for wheezing or shortness of breath., Disp: 18 g, Rfl: 5   aspirin EC 81 MG tablet, Take 1 tablet (81 mg total) by mouth 2 (two) times daily., Disp: 90 tablet, Rfl: 0   B Complex Vitamins (B COMPLEX PO), Take 1 tablet by mouth daily., Disp: , Rfl:    buPROPion (WELLBUTRIN XL) 300 MG  24 hr tablet, Take 300 mg by mouth every morning., Disp: , Rfl:    CARTIA XT 180 MG 24 hr capsule, Take 1 capsule by mouth daily., Disp: , Rfl:    lisinopril (ZESTRIL) 10 MG tablet, Take 10 mg by mouth daily., Disp: , Rfl:    omeprazole (PRILOSEC) 20 MG capsule, Take 1 capsule (20 mg total) by mouth daily., Disp: 30 capsule, Rfl: 11   rosuvastatin (CRESTOR) 20 MG tablet, Take 1 tablet by mouth once daily, Disp: 90 tablet, Rfl: 0   tamsulosin (FLOMAX) 0.4 MG CAPS capsule, Take 0.4 mg by mouth daily., Disp: , Rfl:    Testosterone 1.62 % GEL, Place 20.25 mg onto the skin daily., Disp: , Rfl:    VITAMIN D PO, Take 1 tablet by mouth daily., Disp: , Rfl:    metoprolol tartrate  (LOPRESSOR) 25 MG tablet, Take 1 tablet (25 mg total) by mouth 4 (four) times daily as needed (palpitations)., Disp: 30 tablet, Rfl: 11    Josephine Igo, DO [facility] 08/15/2023 3:16 PM

## 2023-08-15 NOTE — Patient Instructions (Addendum)
Thank you for visiting Dr. Tonia Brooms at [facility]. Today we recommend the following:  Meds ordered this encounter  Medications   omeprazole (PRILOSEC) 20 MG capsule    Sig: Take 1 capsule (20 mg total) by mouth daily.    Dispense:  30 capsule    Refill:  11   Return in about 8 weeks (around 10/10/2023) for with APP.    Please do your part to reduce the spread of COVID-19.

## 2023-09-28 ENCOUNTER — Telehealth: Payer: Self-pay | Admitting: *Deleted

## 2023-09-28 NOTE — Telephone Encounter (Signed)
   Pre-operative Risk Assessment    Patient Name: Khylin Gutridge  DOB: Jul 29, 1961 MRN: [id number]    DATE OF LAST VISIT: 12/01/22 DR. BRIDGETTE CHRISTOPHER DATE OF NEXT VISIT: NONE  Request for Surgical Clearance    Procedure:   RIGHT SECOND HAMMERTOE CORRECTION; POSSIBLE FLEXOR AND EXTENSOR TENOTOMIES; POSSIBLE WEIL OSTEOTOMY; LEFT SECOND TOE FLEXOR TENOTOMY   Date of Surgery:  Clearance TBD                                 Surgeon:  DR. Jonny Ruiz HEWITT Surgeon's Group or Practice Name:  [facility] Phone number:  [phone] ATTN: MEGAN DAVIS Fax number:  [phone]   Type of Clearance Requested:   - Medical ; ASA    Type of Anesthesia:  Not Indicated   Additional requests/questions:    Elpidio Anis   09/28/2023, 5:05 PM

## 2023-10-01 ENCOUNTER — Telehealth: Payer: Self-pay | Admitting: *Deleted

## 2023-10-01 NOTE — Telephone Encounter (Signed)
Primary Cardiologist:[name], MD   Preoperative team, please contact this patient and set up a phone call appointment for further preoperative risk assessment. Please obtain consent and complete medication review. Thank you for your help.   Per office protocol, he may hold aspirin for 5-7 days prior to procedure and should resume as soon as hemodynamically stable postoperatively.  I also confirmed the patient resides in the state of [place]. As per [facility] Medical Board telemedicine laws, the patient must reside in the state in which the provider is licensed.   Devon Aland, NP-C  10/01/2023, 10:25 AM [address] Office [phone] Fax [phone]

## 2023-10-01 NOTE — Telephone Encounter (Signed)
Spoke with patient who would like to be seen in the office. I have scheduled patient on 11/15 at 8:50 a.m. Patient verbalized understanding and thanked me for the call.

## 2023-10-01 NOTE — Telephone Encounter (Signed)
Fax received from Dr. Victorino Dike with [facility] to perform a right second hammertoe correction, possible flexor and extensor tenotomies, possible well osteotomy, left second toe flexor tenotomy on patient.  Patient needs surgery clearance. Surgery is pending. Patient was seen on 08/15/23. Office protocol is a risk assessment can be sent to surgeon if patient has been seen in 60 days or less.   Sending to Dr. Tonia Brooms for risk assessment or recommendations if patient needs to be seen in office prior to surgical procedure.

## 2023-10-17 ENCOUNTER — Encounter: Payer: Self-pay | Admitting: Adult Health

## 2023-10-17 ENCOUNTER — Ambulatory Visit: Payer: BC Managed Care – PPO | Admitting: Adult Health

## 2023-10-17 ENCOUNTER — Ambulatory Visit: Payer: BC Managed Care – PPO

## 2023-10-17 VITALS — BP 138/76 | HR 60 | Temp 98.2°F | Ht 71.0 in | Wt 278.5 lb

## 2023-10-17 DIAGNOSIS — K219 Gastro-esophageal reflux disease without esophagitis: Secondary | ICD-10-CM | POA: Diagnosis not present

## 2023-10-17 DIAGNOSIS — R053 Chronic cough: Secondary | ICD-10-CM

## 2023-10-17 DIAGNOSIS — G4733 Obstructive sleep apnea (adult) (pediatric): Secondary | ICD-10-CM

## 2023-10-17 DIAGNOSIS — R0609 Other forms of dyspnea: Secondary | ICD-10-CM

## 2023-10-17 DIAGNOSIS — Z01811 Encounter for preprocedural respiratory examination: Secondary | ICD-10-CM

## 2023-10-17 NOTE — Patient Instructions (Addendum)
Restart CPAP At bedtime, wear all night long  SD card  CPAP download in 4 weeks.   Discuss with Cardiology that Lisinopril may be aggravating your cough.  Chest xray today  Albuterol inhaler As needed   Continue on Prilosec 20mg  daily  GERD diet   Follow up in 3 months and As needed

## 2023-10-17 NOTE — Progress Notes (Unsigned)
@Patient  ID: Devon Griffith, male    DOB: 11/14/1961, 62 y.o.   MRN: [id number]  Chief Complaint  Patient presents with   Follow-up    Referring provider: D[facility]*  HPI: Never smoker   TEST/EVENTS :   10/17/2023    Sob and   Has had covid   Dry heaves -rx PPI  ? GERD   Severe OSA - Restarted on CPAP  Got new machine, got new mask fulll face, likes it a lot F30  [facility]   Daily cough   2021 Echo DD II , nml pap  Work on diet  healthier eating    Foot surgery   PFT nml  No change in sx with symbicort   Trainer  Limited by knee and foot      Allergies  Allergen Reactions   Bupropion     Other reaction(s): anxiety    Immunization History  Administered Date(s) Administered   Influenza Split 09/22/2014, 10/10/2023   Influenza,inj,Quad PF,6-35 Mos 10/19/2019   PFIZER(Purple Top)SARS-COV-2 Vaccination 02/26/2020, 03/18/2020   Pneumococcal Polysaccharide-23 07/19/2019    Past Medical History:  Diagnosis Date   Achilles tendon disorder    Left   Arrhythmia    wore monitor 2-3 weeks.aprox 5 years ago.   GERD (gastroesophageal reflux disease) 04/12/2020   H/O hiatal hernia    hx of   Hyperlipidemia    Hypertension    Kidney stones    Knee pain    Pain in limb    Sleep apnea    CPAP  settings at 10    Urinary frequency     Tobacco History: Social History   Tobacco Use  Smoking Status Never  Smokeless Tobacco Never   Counseling given: Not Answered   Outpatient Medications Prior to Visit  Medication Sig Dispense Refill   albuterol (VENTOLIN HFA) 108 (90 Base) MCG/ACT inhaler Inhale 2 puffs into the lungs every 6 (six) hours as needed for wheezing or shortness of breath. 18 g 5   aspirin EC 81 MG tablet Take 1 tablet (81 mg total) by mouth 2 (two) times daily. 90 tablet 0   B Complex Vitamins (B COMPLEX PO) Take 1 tablet by mouth daily.     buPROPion (WELLBUTRIN XL) 300 MG 24 hr tablet Take 300 mg by mouth every  morning.     CARTIA XT 180 MG 24 hr capsule Take 1 capsule by mouth daily.     lisinopril (ZESTRIL) 10 MG tablet Take 10 mg by mouth daily.     metoprolol tartrate (LOPRESSOR) 25 MG tablet Take 1 tablet (25 mg total) by mouth 4 (four) times daily as needed (palpitations). 30 tablet 11   omeprazole (PRILOSEC) 20 MG capsule Take 1 capsule (20 mg total) by mouth daily. 30 capsule 11   rosuvastatin (CRESTOR) 20 MG tablet Take 1 tablet by mouth once daily 90 tablet 0   tamsulosin (FLOMAX) 0.4 MG CAPS capsule Take 0.4 mg by mouth daily.     Testosterone 1.62 % GEL Place 20.25 mg onto the skin daily.     VITAMIN D PO Take 1 tablet by mouth daily.     No facility-administered medications prior to visit.     Review of Systems:   Constitutional:   No  weight loss, night sweats,  Fevers, chills, fatigue, or  lassitude.  HEENT:   No headaches,  Difficulty swallowing,  Tooth/dental problems, or  Sore throat,  No sneezing, itching, ear ache, nasal congestion, post nasal drip,   CV:  No chest pain,  Orthopnea, PND, swelling in lower extremities, anasarca, dizziness, palpitations, syncope.   GI  No heartburn, indigestion, abdominal pain, nausea, vomiting, diarrhea, change in bowel habits, loss of appetite, bloody stools.   Resp: No shortness of breath with exertion or at rest.  No excess mucus, no productive cough,  No non-productive cough,  No coughing up of blood.  No change in color of mucus.  No wheezing.  No chest wall deformity  Skin: no rash or lesions.  GU: no dysuria, change in color of urine, no urgency or frequency.  No flank pain, no hematuria   MS:  No joint pain or swelling.  No decreased range of motion.  No back pain.    Physical Exam  BP 138/76 (BP Location: Left Arm, Cuff Size: Large)   Pulse 60   Temp 98.2 F (36.8 C) (Temporal)   Ht 5\' 11"  (1.803 m)   Wt 278 lb 8 oz (126.3 kg)   SpO2 98%   BMI 38.84 kg/m   GEN: A/Ox3; pleasant , NAD, well nourished     HEENT:  [place]/AT,  EACs-clear, TMs-wnl, NOSE-clear, THROAT-clear, no lesions, no postnasal drip or exudate noted.   NECK:  Supple w/ fair ROM; no JVD; normal carotid impulses w/o bruits; no thyromegaly or nodules palpated; no lymphadenopathy.    RESP  Clear  P & A; w/o, wheezes/ rales/ or rhonchi. no accessory muscle use, no dullness to percussion  CARD:  RRR, no m/r/g, no peripheral edema, pulses intact, no cyanosis or clubbing.  GI:   Soft & nt; nml bowel sounds; no organomegaly or masses detected.   Musco: Warm bil, no deformities or joint swelling noted.   Neuro: alert, no focal deficits noted.    Skin: Warm, no lesions or rashes    Lab Results:  CBC    Component Value Date/Time   WBC 6.6 05/27/2019 0603   RBC 4.91 05/27/2019 0603   HGB 15.0 05/27/2019 0603   HCT 43.5 05/27/2019 0603   PLT 209 05/27/2019 0603   MCV 88.6 05/27/2019 0603   MCH 30.5 05/27/2019 0603   MCHC 34.5 05/27/2019 0603   RDW 12.5 05/27/2019 0603   LYMPHSABS 2.1 09/24/2018 1720   MONOABS 0.7 09/24/2018 1720   EOSABS 0.0 09/24/2018 1720   BASOSABS 0.0 09/24/2018 1720    BMET    Component Value Date/Time   NA 138 05/27/2019 0603   K 4.5 05/27/2019 0603   CL 108 05/27/2019 0603   CO2 19 (L) 05/27/2019 0603   GLUCOSE 105 (H) 05/27/2019 0603   BUN 27 (H) 05/27/2019 0603   CREATININE 1.10 05/27/2019 0603   CREATININE 1.07 01/20/2015 1522   CALCIUM 8.9 05/27/2019 0603   GFRNONAA >60 05/27/2019 0603   GFRAA >60 05/27/2019 0603    BNP    Component Value Date/Time   BNP 25.7 09/24/2018 1720    ProBNP No results found for: "PROBNP"  Imaging: No results found.  Administration History     None          Latest Ref Rng & Units 04/09/2020   12:05 PM  PFT Results  FVC-Pre L 4.33   FVC-Predicted Pre % 90   FVC-Post L 4.17   FVC-Predicted Post % 87   Pre FEV1/FVC % % 84   Post FEV1/FCV % % 90   FEV1-Pre L 3.65   FEV1-Predicted Pre % 101   FEV1-Post  L 3.73   DLCO uncorrected  ml/min/mmHg 24.89   DLCO UNC% % 90   DLCO corrected ml/min/mmHg 24.89   DLCO COR %Predicted % 90   DLVA Predicted % 98   TLC L 6.06   TLC % Predicted % 86   RV % Predicted % 72     No results found for: "NITRICOXIDE"      Assessment & Plan:   No problem-specific Assessment & Plan notes found for this encounter.     Rubye Oaks, NP 10/17/2023

## 2023-10-18 DIAGNOSIS — R053 Chronic cough: Secondary | ICD-10-CM | POA: Insufficient documentation

## 2023-10-18 DIAGNOSIS — Z01811 Encounter for preprocedural respiratory examination: Secondary | ICD-10-CM | POA: Insufficient documentation

## 2023-10-18 NOTE — Assessment & Plan Note (Signed)
Continue on PPI therapy.  GERD diet.

## 2023-10-18 NOTE — Assessment & Plan Note (Signed)
Chronic cough suspect is multifactorial.  Would continue on PPI therapy for possible GERD component.  Patient is also on ACE inhibitor which may be causing a daily cough.  Would recommend changing ACE inhibitor if possible.  No perceived benefit with Symbicort.  Albuterol inhaler as needed.  PFTs were normal.  Will check chest x-ray today.  Previous imaging was unrevealing  Plan  Patient Instructions  Restart CPAP At bedtime, wear all night long  SD card  CPAP download in 4 weeks.   Discuss with Cardiology that Lisinopril may be aggravating your cough.  Chest xray today  Albuterol inhaler As needed   Continue on Prilosec 20mg  daily  GERD diet   Follow up in 3 months and As needed

## 2023-10-18 NOTE — Assessment & Plan Note (Signed)
Moderate obstructive sleep apnea.  Patient education given on sleep apnea.  Encouraged patient to wear CPAP every single night.  Will get a CPAP download in 4 weeks.  Plan  Patient Instructions  Restart CPAP At bedtime, wear all night long  SD card  CPAP download in 4 weeks.   Discuss with Cardiology that Lisinopril may be aggravating your cough.  Chest xray today  Albuterol inhaler As needed   Continue on Prilosec 20mg  daily  GERD diet   Follow up in 3 months and As needed

## 2023-10-18 NOTE — Telephone Encounter (Signed)
Tammy will you please add a risk assessment to your note from yesterday and let me know once done? Thanks!

## 2023-10-18 NOTE — Assessment & Plan Note (Signed)
Pulmonary preop risk assessment. Patient has underlying moderate obstructive sleep apnea.  Have encouraged patient on CPAP compliance.  May have a component of some mild intermittent asthma/reactive airways and upper airway cough syndrome.  Previous PFTs are completely normal.  He is a never smoker.  Not on oxygen.  Fully independent.  Would be considered a mild surgical risk from a pulmonary standpoint.  Would recommend using CPAP in the postop setting if indicated.  Major Pulmonary risks identified in the multifactorial risk analysis are but not limited to a) pneumonia; b) recurrent intubation risk; c) prolonged or recurrent acute respiratory failure needing mechanical ventilation; d) prolonged hospitalization; e) DVT/Pulmonary embolism; f) Acute Pulmonary edema  Recommend 1. Short duration of surgery as much as possible and avoid paralytic if possible  2. Recovery in step down or ICU with Pulmonary consultation if indicated  3. DVT prophylaxis if indicated  4. Aggressive pulmonary toilet with o2, bronchodilatation, and incentive spirometry and early ambulation 5. CPAP in postop setting if indicated

## 2023-10-19 NOTE — Telephone Encounter (Signed)
All done

## 2023-10-22 NOTE — Telephone Encounter (Signed)
Copy of risk assessment 10/17/23 faxed to [facility]

## 2023-10-30 ENCOUNTER — Telehealth: Payer: Self-pay

## 2023-10-30 NOTE — Telephone Encounter (Signed)
Has OV 11/02/23. Appt notes already reflect preop clearance and need for EKG. Will be addressed at that time.   Alver Sorrow, NP

## 2023-10-30 NOTE — Telephone Encounter (Signed)
   Pre-operative Risk Assessment    Patient Name: Devon Griffith  DOB: [birth year] MRN: [id number]     LAST OFFICE VISIT: 12/29/22 WITH DR. BRIDGETTE CHRISTOPHER NEXT OFFICE VISIT: 11/15/242 WITH Gillian Shields, NP  Request for Surgical Clearance    Procedure:   #1 RIGHT TOTAL KNEE ARTHROPLASTY                                                            #2 LEFT TOTAL KNEE ARTHROPLASTY   Date of Surgery:  Clearance 12/24/23 FOR RIGHT KNEE             02/14/24 FOR LEFT KNEE                                  Surgeon:  DR. MATTHEW OLIN Surgeon's Group or Practice Name:  [facility]  Phone number:  [phone] ATTN: Rosalva Ferron Fax number:  [phone] (OFFICE FAX)  [phone] ([facility] FAX)    Type of Clearance Requested:   - Medical  - Pharmacy:  Hold Aspirin NONE INDICATED TO BE HELD    Type of Anesthesia:  Spinal   Additional requests/questions:    Robley Fries   10/30/2023, 5:14 PM

## 2023-11-02 ENCOUNTER — Ambulatory Visit (HOSPITAL_BASED_OUTPATIENT_CLINIC_OR_DEPARTMENT_OTHER): Payer: BC Managed Care – PPO | Admitting: Family

## 2023-11-02 ENCOUNTER — Encounter (HOSPITAL_BASED_OUTPATIENT_CLINIC_OR_DEPARTMENT_OTHER): Payer: Self-pay | Admitting: Family

## 2023-11-02 VITALS — BP 132/80 | HR 55 | Ht 71.0 in | Wt 282.2 lb

## 2023-11-02 DIAGNOSIS — Z01810 Encounter for preprocedural cardiovascular examination: Secondary | ICD-10-CM | POA: Diagnosis not present

## 2023-11-02 DIAGNOSIS — I10 Essential (primary) hypertension: Secondary | ICD-10-CM | POA: Diagnosis not present

## 2023-11-02 DIAGNOSIS — I471 Supraventricular tachycardia, unspecified: Secondary | ICD-10-CM | POA: Diagnosis not present

## 2023-11-02 DIAGNOSIS — E782 Mixed hyperlipidemia: Secondary | ICD-10-CM

## 2023-11-02 DIAGNOSIS — G4733 Obstructive sleep apnea (adult) (pediatric): Secondary | ICD-10-CM

## 2023-11-02 NOTE — Progress Notes (Signed)
Cardiology Office Note:  .   Date:  11/02/2023  ID:  Devon Griffith, DOB May 04, 1961, MRN [id number] PCP: Deatra James, MD  [facility] Providers Cardiologist:  Jodelle Red, MD    History of Present Illness: .   Devon Griffith is a 62 y.o. male with hx of pSVT, HLD, HTN, GERD, OSA on CPAP, kidney stones.   Myoview 2014 normal. Echo at that time normal LVEF 60-65%, gr1dd. follow up echo subsequent to COVID 01/2020 LVEF 55-60%, mild LVH, gr2dd. Prior monitor with 3 brief episodes SVT <2 minutes with triggered events NSR. Last seen 12/29/22 doing overall well.   Presents today for follow up. Pending knee and toe surgery. Palpitations have been quiescent. Occasional dizziness. Felt dizzy this morning getting out of the shower occurring daily but fleeting. No near syncope, syncope. No chest pain, dyspnea. BP at home 125/80 - 140/90. Resting heart rate 58-65 bpm. Working out with Systems analyst 3x per week. Making dietary changes, frustrated by slow weight loss.   ROS: Please see the history of present illness.    All other systems reviewed and are negative.   Studies Reviewed: Marland Kitchen   EKG Interpretation Date/Time:  Friday November 02 2023 08:51:19 EST Ventricular Rate:  53 PR Interval:  160 QRS Duration:  94 QT Interval:  404 QTC Calculation: 379 R Axis:   32  Text Interpretation: Sinus bradycardia No acute changes Confirmed by Gillian Shields ([id number]) on 11/02/2023 9:06:51 AM    Cardiac Studies & Procedures       ECHOCARDIOGRAM  ECHOCARDIOGRAM COMPLETE 01/28/2020  Narrative ECHOCARDIOGRAM REPORT    Patient Name:   Devon Griffith Mizell Date of Exam: 01/28/2020 Medical Rec #:  [id number]       Height:       71.0 in Accession #:    [id number]      Weight:       279.0 lb Date of Birth:  1960/12/27      BSA:          2.43 m Patient Age:    58 years        BP:           129/87 mmHg Patient Gender: M               HR:           70 bpm. Exam Location:  [address]  Procedure: 2D Echo, 3D Echo, Cardiac Doppler, Color Doppler and Strain Analysis  Indications:    R06.02 Shortness of Breath  History:        Patient has prior history of Echocardiogram examinations, most recent 06/10/2013. Signs/Symptoms:Chest Pain, Shortness of Breath and Dyspnea; Risk Factors:Hypertension, Dyslipidemia and Sleep Apnea. Palpitations, COVID 19 Virus (December 2020) with Residual SOB and Fatigue.  Sonographer:    Farrel Conners RDCS Referring Phys: [phone] THOMAS A KELLY  IMPRESSIONS   1. Left ventricular ejection fraction, by estimation, is 55 to 60%. The left ventricle has normal function. The left ventrical has no regional wall motion abnormalities. There is mildly increased left ventricular hypertrophy. Left ventricular diastolic parameters are consistent with Grade II diastolic dysfunction (pseudonormalization). 2. Right ventricular systolic function is normal. The right ventricular size is normal. There is normal pulmonary artery systolic pressure. The estimated right ventricular systolic pressure is 28.0 mmHg. 3. The mitral valve is normal in structure and function. no evidence of mitral valve regurgitation. No evidence of mitral stenosis. 4. The aortic valve is abnormal. Aortic valve regurgitation  is not visualized. Mild aortic valve sclerosis is present, with no evidence of aortic valve stenosis. 5. Aortic dilatation noted. 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 6. The inferior vena cava is normal in size with greater than 50% respiratory variability, suggesting right atrial pressure of 3 mmHg.  FINDINGS Left Ventricle: Left ventricular ejection fraction, by estimation, is 55 to 60%. The left ventricle has normal function. The left ventricle has no regional wall motion abnormalities. The left ventricular internal cavity size was normal in size. There is mildly increased left ventricular hypertrophy. Left ventricular diastolic parameters  are consistent with Grade II diastolic dysfunction (pseudonormalization).  Right Ventricle: The right ventricular size is normal. No increase in right ventricular wall thickness. Right ventricular systolic function is normal. There is normal pulmonary artery systolic pressure. The tricuspid regurgitant velocity is 2.50 m/s, and with an assumed right atrial pressure of 3 mmHg, the estimated right ventricular systolic pressure is 28.0 mmHg.  Left Atrium: Left atrial size was normal in size.  Right Atrium: Right atrial size was normal in size.  Pericardium: There is no evidence of pericardial effusion.  Mitral Valve: The mitral valve is normal in structure and function. Normal mobility of the mitral valve leaflets. No evidence of mitral valve regurgitation. No evidence of mitral valve stenosis.  Tricuspid Valve: The tricuspid valve is normal in structure. Tricuspid valve regurgitation is trivial. No evidence of tricuspid stenosis.  Aortic Valve: The aortic valve is abnormal. Aortic valve regurgitation is not visualized. Mild aortic valve sclerosis is present, with no evidence of aortic valve stenosis. There is mild calcification of the aortic valve.  Pulmonic Valve: The pulmonic valve was normal in structure. Pulmonic valve regurgitation is not visualized. No evidence of pulmonic stenosis.  Aorta: Aortic dilatation noted. 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  Venous: The inferior vena cava is normal in size with greater than 50% respiratory variability, suggesting right atrial pressure of 3 mmHg.  IAS/Shunts: No atrial level shunt detected by color flow Doppler.   LEFT VENTRICLE PLAX 2D LVIDd:         4.82 cm   Diastology LVIDs:         2.58 cm   LV e' lateral:   7.96 cm/s LV PW:         1.23 cm   LV E/e' lateral: 11.6 LV IVS:        1.03 cm   LV e' medial:    6.74 cm/s LVOT diam:     2.70 cm   LV E/e' medial:  13.7 LV SV:         149.44 ml LV SV Index:    32.86 LVOT Area:     5.73 cm   RIGHT VENTRICLE RVOT diam:      3.80 cm  LEFT ATRIUM             Index       RIGHT ATRIUM           Index LA diam:        4.60 cm 1.89 cm/m  RA Area:     19.00 cm LA Vol (A2C):   75.8 ml 31.21 ml/m RA Volume:   53.40 ml  21.98 ml/m LA Vol (A4C):   58.7 ml 24.17 ml/m LA Biplane Vol: 66.8 ml 27.50 ml/m AORTIC VALVE LVOT Vmax:   129.00 cm/s LVOT Vmean:  83.600 cm/s LVOT VTI:    0.261 m  AORTA  Ao Root diam: 4.10 cm Ao Asc diam:  3.70 cm  MITRAL VALVE                        TRICUSPID VALVE MV Area (PHT): cm                  TR Peak grad:   25.0 mmHg MV Decel Time: 264 msec             TR Vmax:        250.00 cm/s MV E velocity: 92.20 cm/s 103 cm/s MV A velocity: 76.60 cm/s 70.3 cm/s SHUNTS MV E/A ratio:  1.20       1.5       Systemic VTI:  0.26 m Systemic Diam: 2.70 cm Pulmonic Diam: 3.80 cm  Devon Brass MD Electronically signed by Devon Brass MD Signature Date/Time: 01/28/2020/11:05:27 PM    Final    MONITORS  LONG TERM MONITOR (3-14 DAYS) 12/27/2022  Narrative Patch Wear Time:  13 days and 23 hours  Patient had a min HR of 50 bpm, max HR of 218 bpm, and avg HR of 80 bpm. Predominant underlying rhythm was Sinus Rhythm. 3 Supraventricular Tachycardia runs occurred, the run with the fastest interval lasting 1 min 20 secs with a max rate of 218 bpm, the longest lasting 1 min 30 secs with an avg rate of 150 bpm. No VT, atrial fibrillation, high degree block, or pauses noted. Isolated atrial and ventricular ectopy was rare (<1%). There were 5 triggered events. Four were sinus, one was SVT.           Risk Assessment/Calculations:             Physical Exam:   VS:  BP 132/80 (BP Location: Left Arm, Patient Position: Sitting, Cuff Size: Normal)   Pulse (!) 55   Ht 5\' 11"  (1.803 m)   Wt 282 lb 3.2 oz (128 kg)   SpO2 95%   BMI 39.36 kg/m    Wt Readings from Last 3 Encounters:  11/02/23 282 lb 3.2 oz (128 kg)  10/17/23 278 lb 8  oz (126.3 kg)  08/15/23 278 lb 12.8 oz (126.5 kg)    GEN: Well nourished, well developed in no acute distress NECK: No JVD; No carotid bruits CARDIAC: RRR, no murmurs, rubs, gallops RESPIRATORY:  Clear to auscultation without rales, wheezing or rhonchi  ABDOMEN: Soft, non-tender, non-distended EXTREMITIES:  No edema; No deformity   ASSESSMENT AND PLAN: .    Preop - According to the Revised Cardiac Risk Index (RCRI), his Perioperative Risk of Major Cardiac Event is (%): 0.4His Functional Capacity in METs is: 6.36 according to the Duke Activity Status Index (DASI). Per AHA/ACC guidelines, he is deemed acceptable risk for the planned procedure without additional cardiovascular testing. Will route to surgical team so they are aware.  May hold Aspirin 5-7 days prior to procedure.  OSA - CPAP compliance encouraged.  HTN - BP well controlled. Continue current antihypertensive regimen.   SVT - Quiescent on present dose Diltiazem. Given bradycardia and occasional lightheadedness, considered reducing dose from 180mg  to 120mg  but as symptoms not bothersome he prefers to continue present dose.  HLD - Continue Rosuvastatin 20mg  daily.       Dispo: follow up in 6 mos  Signed, Alver Sorrow, NP

## 2023-11-02 NOTE — Patient Instructions (Signed)
Medication Instructions:  Your physician recommends that you continue on your current medications as directed. Please refer to the Current Medication list given to you today.  You can hold Asprin 5-7 days before your surgery.   *If you need a refill on your cardiac medications before your next appointment, please call your pharmacy*  Lab Work: NONE  Testing/Procedures: NONE  Follow-Up: At [facility], you and your health needs are our priority.  As part of our continuing mission to provide you with exceptional heart care, we have created designated Provider Care Teams.  These Care Teams include your primary Cardiologist (physician) and Advanced Practice Providers (APPs -  Physician Assistants and Nurse Practitioners) who all work together to provide you with the care you need, when you need it.  We recommend signing up for the patient portal called "MyChart".  Sign up information is provided on this After Visit Summary.  MyChart is used to connect with patients for Virtual Visits (Telemedicine).  Patients are able to view lab/test results, encounter notes, upcoming appointments, etc.  Non-urgent messages can be sent to your provider as well.   To learn more about what you can do with MyChart, go to [email or web address].    Your next appointment:   6 month(s)  The format for your next appointment:   In Person  Provider:   Jodelle Red, MD

## 2023-12-06 ENCOUNTER — Telehealth: Payer: Self-pay | Admitting: Adult Health

## 2023-12-06 DIAGNOSIS — G4733 Obstructive sleep apnea (adult) (pediatric): Secondary | ICD-10-CM

## 2023-12-06 NOTE — Telephone Encounter (Signed)
Patient states [facility] needs CPAP pressure settings. Patient phone number is [phone].

## 2023-12-13 NOTE — Telephone Encounter (Signed)
I called and spoke with the pt and notified of response from Tammy  Pt verbalized understanding  Nothing further needed

## 2023-12-13 NOTE — Telephone Encounter (Signed)
Spoke to pt. He stated that [facility] is needing cpap settings added to order that was placed in oct.  [facility] is needing this information ASAP, as they are trying to provide him with the machine before the end of the year.  Tammy, please advise on settings. Thank you!

## 2023-12-13 NOTE — Telephone Encounter (Signed)
Will place order for 5-15 cmH2O.  He did not bring SD card for download   Will need ov for follow up once he is wearing .

## 2023-12-24 ENCOUNTER — Other Ambulatory Visit (HOSPITAL_COMMUNITY): Payer: Self-pay

## 2023-12-24 MED ORDER — TRANEXAMIC ACID 650 MG PO TABS
1950.0000 mg | ORAL_TABLET | Freq: Every day | ORAL | 0 refills | Status: DC
  Filled 2023-12-24: qty 9, 3d supply, fill #0

## 2023-12-26 ENCOUNTER — Ambulatory Visit: Payer: BC Managed Care – PPO | Admitting: Adult Health

## 2024-01-29 ENCOUNTER — Ambulatory Visit: Payer: BC Managed Care – PPO | Admitting: Adult Health

## 2024-01-29 ENCOUNTER — Encounter: Payer: Self-pay | Admitting: Adult Health

## 2024-01-29 VITALS — BP 142/86 | HR 86 | Temp 98.3°F | Ht 71.0 in | Wt 291.2 lb

## 2024-01-29 DIAGNOSIS — G4733 Obstructive sleep apnea (adult) (pediatric): Secondary | ICD-10-CM | POA: Diagnosis not present

## 2024-01-29 DIAGNOSIS — R053 Chronic cough: Secondary | ICD-10-CM | POA: Diagnosis not present

## 2024-01-29 NOTE — Progress Notes (Unsigned)
@Patient  ID: Devon Griffith, male    DOB: 20-Aug-1961, 63 y.o.   MRN: [id number]  Chief Complaint  Patient presents with   Follow-up    Referring provider: Deatra James, MD  HPI: 63 yo male never smoker followed for chronic cough and OSA   TEST/EVENTS :  PFTs April 12, 2020 normal with FEV1 at 103%, ratio 90, FVC 87%, no significant bronchodilator response, DLCO at 90%   CT chest September 24, 2018 negative for PE, 5 mm right middle lobe lung nodule   2D echo 2021 showed diastolic dysfunction grade 2, normal pulmonary artery pressure.   Sleep study in August 2012 showed moderate sleep apnea with AHI 27/hour.   01/29/2024 Follow up; OSA and Cough/RAD  Patient presents for a 50-month follow-up.  Patient has moderate obstructive sleep apnea.  Says overall he is doing very well.  Wears a CPAP every night.  Is currently using a DreamWear full facemask which is much more comfortable.  Says he is now sleeping much better.  Says he did miss a few nights when his wife was sick with the flu.  DME is at [facility].  Download shows good compliance with daily average usage at 5.5 hours.  Patient is on auto CPAP 9 to 20 cm H2O.  AHI 4.5/hour.  Daily average pressure at 11.4 cm H2O.  Patient says overall he does feel his breathing has been doing better.  His cough has essentially resolved.  Feels that his reflux is better control.  Does remain on ACE inhibitor but denies any significant problems with cough currently.  No albuterol use.  Is trying to be active .  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  Says that he is doing better.  Still having to use a cane.  He is working with a Psychologist, educational currently at [employer]    Patient was very appreciative today brought flowers to say thank you to our staff.  I have your dinner out        Allergies  Allergen Reactions   Bupropion     Other reaction(s): anxiety    Immunization History  Administered Date(s) Administered   Influenza Split  09/22/2014, 10/10/2023   Influenza,inj,Quad PF,6-35 Mos 10/19/2019   PFIZER(Purple Top)SARS-COV-2 Vaccination 02/26/2020, 03/18/2020   Pneumococcal Polysaccharide-23 07/19/2019    Past Medical History:  Diagnosis Date   Achilles tendon disorder    Left   Arrhythmia    wore monitor 2-3 weeks.aprox 5 years ago.   GERD (gastroesophageal reflux disease) 04/12/2020   H/O hiatal hernia    hx of   Hyperlipidemia    Hypertension    Kidney stones    Knee pain    Pain in limb    Sleep apnea    CPAP  settings at 10    Urinary frequency     Tobacco History: Social History   Tobacco Use  Smoking Status Never  Smokeless Tobacco Never   Counseling given: Not Answered   Outpatient Medications Prior to Visit  Medication Sig Dispense Refill   aspirin EC 81 MG tablet Take 1 tablet (81 mg total) by mouth 2 (two) times daily. 90 tablet 0   B Complex Vitamins (B COMPLEX PO) Take 1 tablet by mouth daily.     buPROPion (WELLBUTRIN XL) 300 MG 24 hr tablet Take 300 mg by mouth every morning.     CARTIA XT 180 MG 24 hr capsule Take 1 capsule by mouth daily.     lisinopril (  ZESTRIL) 10 MG tablet Take 10 mg by mouth daily.     omeprazole (PRILOSEC) 20 MG capsule Take 1 capsule (20 mg total) by mouth daily. 30 capsule 11   rosuvastatin (CRESTOR) 20 MG tablet Take 1 tablet by mouth once daily 90 tablet 0   Testosterone 1.62 % GEL Place 20.25 mg onto the skin daily.     VITAMIN D PO Take 1 tablet by mouth daily.     metoprolol tartrate (LOPRESSOR) 25 MG tablet Take 1 tablet (25 mg total) by mouth 4 (four) times daily as needed (palpitations). 30 tablet 11   albuterol (VENTOLIN HFA) 108 (90 Base) MCG/ACT inhaler Inhale 2 puffs into the lungs every 6 (six) hours as needed for wheezing or shortness of breath. (Patient not taking: Reported on 01/29/2024) 18 g 5   tranexamic acid (LYSTEDA) 650 MG TABS tablet Take 3 tablets (1,950 mg total) by mouth daily for 3 days (Patient not taking: Reported on  01/29/2024) 9 tablet 0   No facility-administered medications prior to visit.     Review of Systems:   Constitutional:   No  weight loss, night sweats,  Fevers, chills,  fatigue, or  lassitude.  HEENT:   No headaches,  Difficulty swallowing,  Tooth/dental problems, or  Sore throat,                No sneezing, itching, ear ache, nasal congestion, post nasal drip,   CV:  No chest pain,  Orthopnea, PND, swelling in lower extremities, anasarca, dizziness, palpitations, syncope.   GI  No heartburn, indigestion, abdominal pain, nausea, vomiting, diarrhea, change in bowel habits, loss of appetite, bloody stools.   Resp:  No change in color of mucus.  No wheezing.  No chest wall deformity  Skin: no rash or lesions.  GU: no dysuria, change in color of urine, no urgency or frequency.  No flank pain, no hematuria   MS:  No joint pain or swelling.  No decreased range of motion.  No back pain.    Physical Exam  BP (!) 142/86 (BP Location: Left Arm, Patient Position: Sitting, Cuff Size: Large)   Pulse 86   Temp 98.3 F (36.8 C) (Oral)   Ht 5\' 11"  (1.803 m)   Wt 291 lb 3.2 oz (132.1 kg)   SpO2 97%   BMI 40.61 kg/m   GEN: A/Ox3; pleasant , NAD, well nourished , cane    HEENT:  [place]/AT,  NOSE-clear, THROAT-clear, no lesions, no postnasal drip or exudate noted.   NECK:  Supple w/ fair ROM; no JVD; normal carotid impulses w/o bruits; no thyromegaly or nodules palpated; no lymphadenopathy.    RESP  Clear  P & A; w/o, wheezes/ rales/ or rhonchi. no accessory muscle use, no dullness to percussion  CARD:  RRR, no m/r/g, no peripheral edema, pulses intact, no cyanosis or clubbing.  GI:   Soft & nt; nml bowel sounds; no organomegaly or masses detected.   Musco: Warm bil, no deformities or joint swelling noted.   Neuro: alert, no focal deficits noted.    Skin: Warm, no lesions or rashes    Lab Results:  CBC    Component Value Date/Time   WBC 6.6 05/27/2019 0603   RBC 4.91  05/27/2019 0603   HGB 15.0 05/27/2019 0603   HCT 43.5 05/27/2019 0603   PLT 209 05/27/2019 0603   MCV 88.6 05/27/2019 0603   MCH 30.5 05/27/2019 0603   MCHC 34.5 05/27/2019 0603   RDW 12.5 05/27/2019  0603   LYMPHSABS 2.1 09/24/2018 1720   MONOABS 0.7 09/24/2018 1720   EOSABS 0.0 09/24/2018 1720   BASOSABS 0.0 09/24/2018 1720    BMET    Component Value Date/Time   NA 138 05/27/2019 0603   K 4.5 05/27/2019 0603   CL 108 05/27/2019 0603   CO2 19 (L) 05/27/2019 0603   GLUCOSE 105 (H) 05/27/2019 0603   BUN 27 (H) 05/27/2019 0603   CREATININE 1.10 05/27/2019 0603   CREATININE 1.07 01/20/2015 1522   CALCIUM 8.9 05/27/2019 0603   GFRNONAA >60 05/27/2019 0603   GFRAA >60 05/27/2019 0603    BNP    Component Value Date/Time   BNP 25.7 09/24/2018 1720    ProBNP No results found for: "PROBNP"  Imaging: No results found.  Administration History     None          Latest Ref Rng & Units 04/09/2020   12:05 PM  PFT Results  FVC-Pre L 4.33   FVC-Predicted Pre % 90   FVC-Post L 4.17   FVC-Predicted Post % 87   Pre FEV1/FVC % % 84   Post FEV1/FCV % % 90   FEV1-Pre L 3.65   FEV1-Predicted Pre % 101   FEV1-Post L 3.73   DLCO uncorrected ml/min/mmHg 24.89   DLCO UNC% % 90   DLCO corrected ml/min/mmHg 24.89   DLCO COR %Predicted % 90   DLVA Predicted % 98   TLC L 6.06   TLC % Predicted % 86   RV % Predicted % 72     No results found for: "NITRICOXIDE"      Assessment & Plan:   No problem-specific Assessment & Plan notes found for this encounter.     Rubye Oaks, NP 01/29/2024

## 2024-01-29 NOTE — Patient Instructions (Addendum)
Continue on CPAP At bedtime, wear all night long  Healthy sleep regimen  Do not drive if sleepy  Work on healthy weight.  Albuterol inhaler As needed   Continue on Prilosec 20mg  daily  GERD diet  Follow up in 6 months with Dr. Wynona Neat or Clent Ridges NP

## 2024-01-30 NOTE — Assessment & Plan Note (Signed)
Chronic cough/reactive airways currently improved.  Continue on trigger prevention.  Albuterol as needed

## 2024-01-30 NOTE — Assessment & Plan Note (Signed)
Moderate obstructive sleep apnea.  Patient is doing well on CPAP with perceived benefit.  Continue all current settings.  Plan  Patient Instructions  Continue on CPAP At bedtime, wear all night long  Healthy sleep regimen  Do not drive if sleepy  Work on healthy weight.  Albuterol inhaler As needed   Continue on Prilosec 20mg  daily  GERD diet  Follow up in 6 months with Dr. Wynona Neat or Clent Ridges NP

## 2024-03-05 ENCOUNTER — Other Ambulatory Visit (HOSPITAL_BASED_OUTPATIENT_CLINIC_OR_DEPARTMENT_OTHER): Payer: Self-pay | Admitting: Cardiology

## 2024-03-05 DIAGNOSIS — R002 Palpitations: Secondary | ICD-10-CM

## 2024-03-05 DIAGNOSIS — I471 Supraventricular tachycardia, unspecified: Secondary | ICD-10-CM

## 2024-04-14 ENCOUNTER — Emergency Department (HOSPITAL_COMMUNITY)
Admission: EM | Admit: 2024-04-14 | Discharge: 2024-04-14 | Disposition: A | Discharge status: Home / Self Care | Payer: PRIVATE HEALTH INSURANCE | Attending: Emergency Medicine | Admitting: Emergency Medicine

## 2024-04-14 DIAGNOSIS — I10 Essential (primary) hypertension: Secondary | ICD-10-CM | POA: Insufficient documentation

## 2024-04-14 DIAGNOSIS — N179 Acute kidney failure, unspecified: Secondary | ICD-10-CM | POA: Insufficient documentation

## 2024-04-14 DIAGNOSIS — I959 Hypotension, unspecified: Secondary | ICD-10-CM | POA: Diagnosis not present

## 2024-04-14 DIAGNOSIS — R42 Dizziness and giddiness: Secondary | ICD-10-CM | POA: Diagnosis present

## 2024-04-14 DIAGNOSIS — L905 Scar conditions and fibrosis of skin: Secondary | ICD-10-CM | POA: Diagnosis not present

## 2024-04-14 DIAGNOSIS — E860 Dehydration: Secondary | ICD-10-CM | POA: Insufficient documentation

## 2024-04-14 DIAGNOSIS — Z7982 Long term (current) use of aspirin: Secondary | ICD-10-CM | POA: Insufficient documentation

## 2024-04-14 DIAGNOSIS — Z79899 Other long term (current) drug therapy: Secondary | ICD-10-CM | POA: Diagnosis not present

## 2024-04-14 LAB — CBC WITH DIFFERENTIAL/PLATELET
Abs Immature Granulocytes: 0.04 10*3/uL (ref 0.00–0.07)
Basophils Absolute: 0.1 10*3/uL (ref 0.0–0.1)
Basophils Relative: 1 %
Eosinophils Absolute: 0.1 10*3/uL (ref 0.0–0.5)
Eosinophils Relative: 1 %
HCT: 42.3 % (ref 39.0–52.0)
Hemoglobin: 13.6 g/dL (ref 13.0–17.0)
Immature Granulocytes: 0 %
Lymphocytes Relative: 17 %
Lymphs Abs: 1.7 10*3/uL (ref 0.7–4.0)
MCH: 30.2 pg (ref 26.0–34.0)
MCHC: 32.2 g/dL (ref 30.0–36.0)
MCV: 93.8 fL (ref 80.0–100.0)
Monocytes Absolute: 0.8 10*3/uL (ref 0.1–1.0)
Monocytes Relative: 8 %
Neutro Abs: 7 10*3/uL (ref 1.7–7.7)
Neutrophils Relative %: 73 %
Platelets: 266 10*3/uL (ref 150–400)
RBC: 4.51 MIL/uL (ref 4.22–5.81)
RDW: 12.6 % (ref 11.5–15.5)
WBC: 9.7 10*3/uL (ref 4.0–10.5)
nRBC: 0 % (ref 0.0–0.2)

## 2024-04-14 LAB — COMPREHENSIVE METABOLIC PANEL WITH GFR
ALT: 17 U/L (ref 0–44)
AST: 11 U/L — ABNORMAL LOW (ref 15–41)
Albumin: 3.9 g/dL (ref 3.5–5.0)
Alkaline Phosphatase: 45 U/L (ref 38–126)
Anion gap: 7 (ref 5–15)
BUN: 30 mg/dL — ABNORMAL HIGH (ref 8–23)
CO2: 26 mmol/L (ref 22–32)
Calcium: 9.2 mg/dL (ref 8.9–10.3)
Chloride: 105 mmol/L (ref 98–111)
Creatinine, Ser: 1.4 mg/dL — ABNORMAL HIGH (ref 0.61–1.24)
GFR, Estimated: 56 mL/min — ABNORMAL LOW (ref >60)
Glucose, Bld: 106 mg/dL — ABNORMAL HIGH (ref 70–99)
Potassium: 4.6 mmol/L (ref 3.5–5.1)
Sodium: 138 mmol/L (ref 135–145)
Total Bilirubin: 0.7 mg/dL (ref 0.0–1.2)
Total Protein: 7.4 g/dL (ref 6.5–8.1)

## 2024-04-14 LAB — TROPONIN I (HIGH SENSITIVITY)
Troponin I (High Sensitivity): 6 ng/L (ref <18)
Troponin I (High Sensitivity): 6 ng/L (ref <18)

## 2024-04-14 MED ORDER — SODIUM CHLORIDE 0.9 % IV BOLUS
1000.0000 mL | Freq: Once | INTRAVENOUS | Status: AC
  Administered 2024-04-14: 1000 mL via INTRAVENOUS

## 2024-04-14 NOTE — ED Provider Notes (Signed)
 [place] EMERGENCY DEPARTMENT AT [facility] Provider Note   CSN: [id number] Arrival date & time: 04/14/24  1406     History  Chief Complaint  Patient presents with   Dizziness    Yoshihiro Trembath is a 63 y.o. male.  With a past history of hypertension and obstructive sleep apnea who presents to ED for dizziness.  Patient was completing a rehab session today and was noted to be lightheaded and hypotensive while completing rehab exercises.  Received 1 L fluids prior to arrival.  Had recent arthrocentesis and is on antibiotics given concern for potential infection in the left knee.  No chest pain shortness of breath fevers chills nausea vomiting diarrhea.  Reports feeling much better after 1 L of fluids and drinking Gatorade in the waiting room.   Dizziness      Home Medications Prior to Admission medications   Medication Sig Start Date End Date Taking? Authorizing Provider  aspirin  EC 81 MG tablet Take 1 tablet (81 mg total) by mouth 2 (two) times daily. 05/27/19   Debbra Fairy, PA-C  B Complex Vitamins (B COMPLEX PO) Take 1 tablet by mouth daily.    [provider]  buPROPion (WELLBUTRIN XL) 300 MG 24 hr tablet Take 300 mg by mouth every morning. 09/13/22   [provider]  CARTIA  XT 180 MG 24 hr capsule Take 1 capsule by mouth daily.    [provider]  lisinopril (ZESTRIL) 10 MG tablet Take 10 mg by mouth daily.    [provider]  metoprolol  tartrate (LOPRESSOR ) 25 MG tablet TAKE ONE TABLET BY MOUTH FOUR TIMES DAILY AS NEEDED (PALPITATIONS) 03/05/24   Sheryle Donning, MD  omeprazole  (PRILOSEC) 20 MG capsule Take 1 capsule (20 mg total) by mouth daily. 08/15/23   Prudy Brownie, DO  rosuvastatin  (CRESTOR ) 20 MG tablet Take 1 tablet by mouth once daily 07/25/21   Millicent Ally, MD  Testosterone  1.62 % GEL Place 20.25 mg onto the skin daily. 09/13/22   [provider]  VITAMIN D  PO Take 1 tablet by mouth daily.     [provider]      Allergies    Bupropion    Review of Systems   Review of Systems  Neurological:  Positive for dizziness.    Physical Exam Updated Vital Signs BP 138/79   Pulse (!) 57   Temp 98.3 F (36.8 C) (Oral)   Resp 12   SpO2 100%  Physical Exam Vitals and nursing note reviewed.  HENT:     Head: Normocephalic and atraumatic.  Eyes:     Pupils: Pupils are equal, round, and reactive to light.  Cardiovascular:     Rate and Rhythm: Normal rate and regular rhythm.  Pulmonary:     Effort: Pulmonary effort is normal.     Breath sounds: Normal breath sounds.  Abdominal:     Palpations: Abdomen is soft.     Tenderness: There is no abdominal tenderness.  Musculoskeletal:     Comments: Healing scars over bilateral anterior knees status post bilateral TKR No overlying erythema fluctuance or drainage Full active range of motion with flexion of bilateral knees  Skin:    General: Skin is warm and dry.  Neurological:     General: No focal deficit present.     Mental Status: He is alert and oriented to person, place, and time.     Sensory: No sensory deficit.     Motor: No weakness.  Psychiatric:        Mood and Affect: Mood normal.     ED Results / Procedures / Treatments   Labs (all labs ordered are listed, but only abnormal results are displayed) Labs Reviewed  COMPREHENSIVE METABOLIC PANEL WITH GFR - Abnormal; Notable for the following components:      Result Value   Glucose, Bld 106 (*)    BUN 30 (*)    Creatinine, Ser 1.40 (*)    AST 11 (*)    GFR, Estimated 56 (*)    All other components within normal limits  CBC WITH DIFFERENTIAL/PLATELET  TROPONIN I (HIGH SENSITIVITY)  TROPONIN I (HIGH SENSITIVITY)    EKG EKG Interpretation Date/Time:  Monday April 14 2024 15:04:33 EDT Ventricular Rate:  56 PR Interval:  168 QRS Duration:  101 QT Interval:  431 QTC Calculation: 416 R Axis:   -1  Text Interpretation: Sinus rhythm Confirmed by Rafael Bun [phone]) on 04/14/2024 5:06:21 PM  Radiology No results found.  Procedures Procedures    Medications Ordered in ED Medications  sodium chloride  0.9 % bolus 1,000 mL (0 mLs Intravenous Stopped 04/14/24 1857)    ED Course/ Medical Decision Making/ A&P Clinical Course as of 04/14/24 1931  Mon Apr 14, 2024  1930 Reassessed patient.  He feels much better after IV fluids.  Orthostatic vital signs within normal limits.  He has been normotensive here.  Appropriate for discharge with most likely etiology of presentation today being dehydration and AKI due to prerenal hypovolemia [MP]    Clinical Course User Index [MP] Sallyanne Creamer, DO                                 Medical Decision Making 63 year old male with history as above presenting given concern for hypotension and dizziness at rehab.  Had recent bilateral TKR's earlier this year and has been completing rehab as scheduled.  Also recent swelling of the left knee status post TKR with arthrocentesis in the orthopedic office (Dr.Olin ) last week.  He has been on antibiotics for empiric coverage until synovial fluid studies have resulted.  He is feeling much better after 1 L of fluids and drinking some Gatorade here in the waiting room.  Benign physical exam with no focal neurologic deficits.  Currently normotensive and slightly bradycardic which he has a history of.  Differential diagnosis includes electrolyte imbalance, dehydration, anemia dysrhythmia and infection.  Will obtain laboratory workup EKG and provide fluids for rehydration           Final Clinical Impression(s) / ED Diagnoses Final diagnoses:  Dehydration  AKI (acute kidney injury) [facility])    Rx / DC Orders ED Discharge Orders     None         Sallyanne Creamer, DO 04/14/24 1931

## 2024-04-14 NOTE — ED Provider Triage Note (Signed)
 Emergency Medicine Provider Triage Evaluation Note  Drayden Cleckler , a 63 y.o. male  was evaluated in triage.  Pt complains of weakness and fatigue.  Low bp at rehab.  Pt seen at Urgent care.  Pt given Iv fluids.  Pt still hypotensive Pt is on antibiotics for possible knee infection.  Pt had knee aspiration at Orthopaedist.   Review of Systems  Positive: Knee swelling.   Negative:   Physical Exam  BP 116/73 (BP Location: Left Arm)   Pulse (!) 59   Temp 98.3 F (36.8 C) (Oral)   Resp 18   SpO2 99%  Gen:   Awake, no distress   Resp:  Normal effort  MSK:   Moves extremities, no effusion  Other:    Medical Decision Making  Medically screening exam initiated at 2:55 PM.  Appropriate orders placed.  Darletta Ehrich was informed that the remainder of the evaluation will be completed by another provider, this initial triage assessment does not replace that evaluation, and the importance of remaining in the ED until their evaluation is complete.     Sandi Crosby, [place] 04/14/24 1457

## 2024-04-14 NOTE — ED Triage Notes (Signed)
 Pt arrived reporting he had a episode of dizziness this morning while at rehab. Reports in rehab for ble knee replacement a few months ago. States  took his BP and it was low 76/52. States he was advised to go to UC. They gave him a liter of fluids there. Also endorses low heart rate. Denies cp, shob, or any other issues.

## 2024-04-14 NOTE — Discharge Instructions (Addendum)
 You were seen in the emerged from for dehydration Your blood work showed slightly elevated renal function which is consistent with dehydration You felt much better after 2 L of IV fluids and drinking some Gatorade here Your blood pressure remained stable and you were able to stand up without a dropping Is important that you follow-up with your primary care doctor in 1 week for reevaluation Return to the emergency department for repeated dizziness, severe dehydration or any other concerns Keep well-hydrated at home and drink plenty of fluids

## 2024-07-04 ENCOUNTER — Other Ambulatory Visit: Payer: Self-pay | Admitting: Adult Health

## 2024-07-04 NOTE — Telephone Encounter (Signed)
 Copied from CRM [id number]. Topic: Clinical - Medication Refill >> Jul 04, 2024  1:29 PM Corean SAUNDERS wrote: Medication: omeprazole  (PRILOSEC) 20 MG capsule   Has the patient contacted their pharmacy? Yes, needs new prescription. (Agent: If no, request that the patient contact the pharmacy for the refill. If patient does not wish to contact the pharmacy document the reason why and proceed with request.) (Agent: If yes, when and what did the pharmacy advise?)  This is the patient's preferred pharmacy:  [facility] - [place], [place] - [address] [place] [address] Phone: [phone] Fax: [phone]  Is this the correct pharmacy for this prescription? Yes If no, delete pharmacy and type the correct one.   Has the prescription been filled recently? Yes  Is the patient out of the medication? Unknown  Has the patient been seen for an appointment in the last year OR does the patient have an upcoming appointment? Yes  Can we respond through MyChart? no  Agent: Please be advised that Rx refills may take up to 3 business days. We ask that you follow-up with your pharmacy.

## 2024-07-07 MED ORDER — OMEPRAZOLE 20 MG PO CPDR: 20.0000 mg | DELAYED_RELEASE_CAPSULE | Freq: Every day | ORAL | 11 refills | Status: DC

## 2024-07-29 ENCOUNTER — Other Ambulatory Visit (HOSPITAL_BASED_OUTPATIENT_CLINIC_OR_DEPARTMENT_OTHER): Payer: Self-pay | Admitting: Cardiology

## 2024-07-29 DIAGNOSIS — I471 Supraventricular tachycardia, unspecified: Secondary | ICD-10-CM

## 2024-07-29 DIAGNOSIS — R002 Palpitations: Secondary | ICD-10-CM

## 2024-08-26 ENCOUNTER — Other Ambulatory Visit (HOSPITAL_BASED_OUTPATIENT_CLINIC_OR_DEPARTMENT_OTHER): Payer: Self-pay | Admitting: Cardiology

## 2024-08-26 DIAGNOSIS — I471 Supraventricular tachycardia, unspecified: Secondary | ICD-10-CM

## 2024-08-26 DIAGNOSIS — R002 Palpitations: Secondary | ICD-10-CM

## 2024-09-19 ENCOUNTER — Other Ambulatory Visit (HOSPITAL_BASED_OUTPATIENT_CLINIC_OR_DEPARTMENT_OTHER): Payer: Self-pay | Admitting: Cardiology

## 2024-09-19 DIAGNOSIS — R002 Palpitations: Secondary | ICD-10-CM

## 2024-09-19 DIAGNOSIS — I471 Supraventricular tachycardia, unspecified: Secondary | ICD-10-CM

## 2024-09-22 ENCOUNTER — Other Ambulatory Visit: Payer: Self-pay

## 2024-09-22 ENCOUNTER — Ambulatory Visit: Payer: BC Managed Care – PPO | Admitting: Adult Health

## 2024-09-22 ENCOUNTER — Encounter: Payer: Self-pay | Admitting: Adult Health

## 2024-09-22 VITALS — BP 118/70 | HR 76 | Temp 98.4°F | Ht 71.0 in | Wt 288.4 lb

## 2024-09-22 DIAGNOSIS — R053 Chronic cough: Secondary | ICD-10-CM

## 2024-09-22 DIAGNOSIS — Z23 Encounter for immunization: Secondary | ICD-10-CM

## 2024-09-22 DIAGNOSIS — G4733 Obstructive sleep apnea (adult) (pediatric): Secondary | ICD-10-CM | POA: Diagnosis not present

## 2024-09-22 DIAGNOSIS — K219 Gastro-esophageal reflux disease without esophagitis: Secondary | ICD-10-CM

## 2024-09-22 DIAGNOSIS — I471 Supraventricular tachycardia, unspecified: Secondary | ICD-10-CM

## 2024-09-22 DIAGNOSIS — R002 Palpitations: Secondary | ICD-10-CM

## 2024-09-22 DIAGNOSIS — Z6841 Body Mass Index (BMI) 40.0 and over, adult: Secondary | ICD-10-CM

## 2024-09-22 NOTE — Telephone Encounter (Signed)
 Please resend to local pharmacy. Thanks

## 2024-09-22 NOTE — Patient Instructions (Addendum)
 Continue on CPAP At bedtime, wear all night long  Healthy sleep regimen  Do not drive if sleepy  Work on healthy weight.  Discuss with your Primary MD that Lisinopril may be aggravating your cough.  Albuterol  inhaler As needed   Continue on Prilosec 20mg  daily  GERD diet  Discuss with PCP if you are candidate for Zepbound.  Flu shot today.  Follow up in 1 year with Dr. Neda or Emerie Vanderkolk NP

## 2024-09-22 NOTE — Progress Notes (Signed)
 @Patient  ID: Devon Griffith, male    DOB: May 08, 1961, 63 y.o.   MRN: [id number]  Chief Complaint  Patient presents with   Follow-up    OSA and Chronic Cough    Referring provider: Sun, Vyvyan, MD  HPI: 63 year old male never smoker followed for obstructive sleep apnea and chronic cough    TEST/EVENTS :  PFTs April 12, 2020 normal with FEV1 at 103%, ratio 90, FVC 87%, no significant bronchodilator response, DLCO at 90%   CT chest September 24, 2018 negative for PE, 5 mm right middle lobe lung nodule   2D echo 2021 showed diastolic dysfunction grade 2, normal pulmonary artery pressure.   Sleep study in August 2012 showed moderate sleep apnea with AHI 27/hour.  Discussed the use of AI scribe software for clinical note transcription with the patient, who gave verbal consent to proceed.  History of Present Illness Devon Griffith is a 63 year old male who presents for a six-month CPAP follow-up.  He is doing well on CPAP.  Says he recently replaced his hose which had been incorrect and feels that the pressure is more comfortable.  He is using a modified full face mask and states that the pressure feels fine.  CPAP download shows excellent compliance with daily average usage at 6 hours.  He is on auto CPAP 9 to 20 cm H2O.  AHI 2.4/hour.  Daily average pressure at 11.5 cm H2O.  He says overall he continues to have some intermittent cough.  He denies any fever or discolored mucus.  Denies any wheezing.  No increased albuterol  use.  Does continue on lisinopril daily.  Previous pulmonary function testing in 2021 was normal.  Patient is a never smoker  He is actively working on [employer]. He tracks his diet using [employer] and acknowledges consuming too much alcohol and unhealthy foods. He is working with a Psychologist, educational at [employer], increasing his sessions from two to three days a week, and aims to add more walking to his routine. He previously lost a significant amount of weight  six years ago through a strict diet but found it unsustainable.  He has an upcoming appointment with his primary care provider to discuss weight loss options.       Allergies  Allergen Reactions   Bupropion     Other reaction(s): anxiety    Immunization History  Administered Date(s) Administered   Fluzone Influenza virus vaccine,trivalent (IIV3), split virus 09/22/2014   Influenza Split 10/10/2023   Influenza,inj,Quad PF,6-35 Mos 10/19/2019   PFIZER(Purple Top)SARS-COV-2 Vaccination 02/26/2020, 03/18/2020   Pneumococcal Polysaccharide-23 07/19/2019    Past Medical History:  Diagnosis Date   Achilles tendon disorder    Left   Arrhythmia    wore monitor 2-3 weeks.aprox 5 years ago.   GERD (gastroesophageal reflux disease) 04/12/2020   H/O hiatal hernia    hx of   Hyperlipidemia    Hypertension    Kidney stones    Knee pain    Pain in limb    Sleep apnea    CPAP  settings at 10    Urinary frequency     Tobacco History: Social History   Tobacco Use  Smoking Status Never  Smokeless Tobacco Never   Counseling given: Not Answered   Outpatient Medications Prior to Visit  Medication Sig Dispense Refill   aspirin  EC 81 MG tablet Take 1 tablet (81 mg total) by mouth 2 (two) times daily. 90 tablet 0   B Complex  Vitamins (B COMPLEX PO) Take 1 tablet by mouth daily.     buPROPion (WELLBUTRIN XL) 300 MG 24 hr tablet Take 300 mg by mouth every morning.     CARTIA  XT 180 MG 24 hr capsule Take 1 capsule by mouth daily.     lisinopril (ZESTRIL) 10 MG tablet Take 10 mg by mouth daily.     metoprolol  tartrate (LOPRESSOR ) 25 MG tablet TAKE ONE TABLET BY MOUTH FOUR TIMES A DAY AS NEEDED FOR PALPITATIONS (Patient taking differently: as needed.) 120 tablet 2   omeprazole  (PRILOSEC) 20 MG capsule Take 1 capsule (20 mg total) by mouth daily. 30 capsule 11   rosuvastatin  (CRESTOR ) 20 MG tablet Take 1 tablet by mouth once daily 90 tablet 0   VITAMIN D  PO Take 1 tablet by mouth daily.      Testosterone  1.62 % GEL Place 20.25 mg onto the skin daily. (Patient not taking: Reported on 09/22/2024)     No facility-administered medications prior to visit.     Review of Systems:   Constitutional:   No  weight loss, night sweats,  Fevers, chills, +fatigue, or  lassitude.  HEENT:   No headaches,  Difficulty swallowing,  Tooth/dental problems, or  Sore throat,                No sneezing, itching, ear ache, nasal congestion, post nasal drip,   CV:  No chest pain,  Orthopnea, PND, swelling in lower extremities, anasarca, dizziness, palpitations, syncope.   GI  No heartburn, indigestion, abdominal pain, nausea, vomiting, diarrhea, change in bowel habits, loss of appetite, bloody stools.   Resp: No shortness of breath with exertion or at rest.  No excess mucus, no productive cough,  No non-productive cough,  No coughing up of blood.  No change in color of mucus.  No wheezing.  No chest wall deformity  Skin: no rash or lesions.  GU: no dysuria, change in color of urine, no urgency or frequency.  No flank pain, no hematuria   MS:  No joint pain or swelling.  No decreased range of motion.  No back pain.    Physical Exam  BP 118/70   Pulse 76   Temp 98.4 F (36.9 C)   Ht 5' 11 (1.803 m)   Wt 288 lb 6.4 oz (130.8 kg)   SpO2 97% Comment: RA  BMI 40.22 kg/m   GEN: A/Ox3; pleasant , NAD, well nourished    HEENT:  [place]/AT,  EACs-clear, TMs-wnl, NOSE-clear, THROAT-clear, no lesions, no postnasal drip or exudate noted.   NECK:  Supple w/ fair ROM; no JVD; normal carotid impulses w/o bruits; no thyromegaly or nodules palpated; no lymphadenopathy.    RESP  Clear  P & A; w/o, wheezes/ rales/ or rhonchi. no accessory muscle use, no dullness to percussion  CARD:  RRR, no m/r/g, no peripheral edema, pulses intact, no cyanosis or clubbing.  GI:   Soft & nt; nml bowel sounds; no organomegaly or masses detected.   Musco: Warm bil, no deformities or joint swelling noted.   Neuro:  alert, no focal deficits noted.    Skin: Warm, no lesions or rashes    Lab Results:    BNP   ProBNP No results found for: PROBNP  Imaging: No results found.  Administration History     None          Latest Ref Rng & Units 04/09/2020   12:05 PM  PFT Results  FVC-Pre L 4.33   FVC-Predicted  Pre % 90   FVC-Post L 4.17   FVC-Predicted Post % 87   Pre FEV1/FVC % % 84   Post FEV1/FCV % % 90   FEV1-Pre L 3.65   FEV1-Predicted Pre % 101   FEV1-Post L 3.73   DLCO uncorrected ml/min/mmHg 24.89   DLCO UNC% % 90   DLCO corrected ml/min/mmHg 24.89   DLCO COR %Predicted % 90   DLVA Predicted % 98   TLC L 6.06   TLC % Predicted % 86   RV % Predicted % 72     No results found for: NITRICOXIDE      Assessment & Plan:   Assessment and Plan Assessment & Plan Obstructive sleep apnea   Obstructive sleep apnea is well-controlled with CPAP therapy. He reports improved usage and comfort after receiving the correct hose for his machine. CPAP pressure settings are appropriate and do not require adjustment. He uses a modified full face mask, which is effective. Continue current CPAP therapy with the correct hose and modified full face mask.  Chronic cough, ?ACE inhibitor related cough. likely secondary to lisinopril   Discuss with primary care physician about alternative medications to lisinopril. Previous pulmonary function testing was normal.  Continue on GERD management.  Previous chest x-ray was clear 09/2023.    Obesity   Obesity persists despite efforts to modify diet and increase physical activity. He is considering GLP-1 therapy as an adjunct to his weight loss plan. GLP-1 therapy is recognized for its effectiveness in weight management when combined with lifestyle changes. Zepbound, a GLP-1 receptor agonist, may be covered for weight loss with sleep apnea.  Discuss GLP-1 therapy, specifically Zepbound, with primary care physician. Check insurance coverage for  Zepbound, considering sleep apnea as a potential indication. Consider a new sleep study if necessary for insurance coverage.  Cerumen impaction   Mild cerumen noted, with no significant bleeding or scratches in the ear canal. Excessive cleaning may be causing irritation. Advise against excessive ear cleaning to prevent irritation.     Plan  Patient Instructions  Continue on CPAP At bedtime, wear all night long  Healthy sleep regimen  Do not drive if sleepy  Work on healthy weight.  Discuss with your Primary MD that Lisinopril may be aggravating your cough.  Albuterol  inhaler As needed   Continue on Prilosec 20mg  daily  GERD diet  Discuss with PCP if you are candidate for Zepbound.  Flu shot today.  Follow up in 1 year with Dr. Neda or Jarred Purtee NP       Madelin Stank, NP 09/22/2024

## 2024-09-24 MED ORDER — METOPROLOL TARTRATE 25 MG PO TABS: ORAL_TABLET | ORAL | 1 refills | Status: AC

## 2024-10-14 ENCOUNTER — Other Ambulatory Visit: Payer: Self-pay | Admitting: Family Medicine

## 2024-10-14 DIAGNOSIS — N6341 Unspecified lump in right breast, subareolar: Secondary | ICD-10-CM

## 2024-10-22 ENCOUNTER — Other Ambulatory Visit: Payer: Self-pay | Admitting: Medical Genetics

## 2024-10-23 ENCOUNTER — Ambulatory Visit
Admission: RE | Admit: 2024-10-23 | Discharge: 2024-10-23 | Disposition: A | Discharge status: Home / Self Care | Payer: PRIVATE HEALTH INSURANCE | Source: Ambulatory Visit | Attending: Family Medicine | Admitting: Family Medicine

## 2024-10-23 ENCOUNTER — Ambulatory Visit: Payer: PRIVATE HEALTH INSURANCE

## 2024-10-23 DIAGNOSIS — N6341 Unspecified lump in right breast, subareolar: Secondary | ICD-10-CM

## 2024-10-27 ENCOUNTER — Ambulatory Visit (HOSPITAL_BASED_OUTPATIENT_CLINIC_OR_DEPARTMENT_OTHER): Payer: PRIVATE HEALTH INSURANCE | Admitting: Cardiology

## 2024-11-06 ENCOUNTER — Encounter (HOSPITAL_BASED_OUTPATIENT_CLINIC_OR_DEPARTMENT_OTHER): Payer: Self-pay | Admitting: Cardiology

## 2024-11-06 ENCOUNTER — Ambulatory Visit (INDEPENDENT_AMBULATORY_CARE_PROVIDER_SITE_OTHER): Payer: PRIVATE HEALTH INSURANCE | Admitting: Cardiology

## 2024-11-06 VITALS — BP 142/88 | HR 90 | Ht 70.5 in | Wt 278.0 lb

## 2024-11-06 DIAGNOSIS — I471 Supraventricular tachycardia, unspecified: Secondary | ICD-10-CM

## 2024-11-06 DIAGNOSIS — I10 Essential (primary) hypertension: Secondary | ICD-10-CM | POA: Diagnosis not present

## 2024-11-06 DIAGNOSIS — I77810 Thoracic aortic ectasia: Secondary | ICD-10-CM | POA: Diagnosis not present

## 2024-11-06 DIAGNOSIS — R002 Palpitations: Secondary | ICD-10-CM | POA: Diagnosis not present

## 2024-11-06 MED ORDER — DILTIAZEM HCL ER COATED BEADS 180 MG PO CP24: 180.0000 mg | ORAL_CAPSULE | Freq: Every day | ORAL | 3 refills | Status: AC

## 2024-11-06 NOTE — Progress Notes (Signed)
 Cardiology Office Note:  .   Date:  11/06/2024  ID:  Devon Griffith, DOB Mar 28, 1961, MRN [id number] PCP: Sun, Vyvyan, MD  [facility] Providers Cardiologist:  Shelda Bruckner, MD {  History of Present Illness: .   Devon Griffith is a 63 y.o. male with a hx of pSVT, hyperlipidemia, hypertension, GERD, obstructive sleep apnea on CPAP, kidney stones who is seen for follow-up. He was previously followed by Dr. Burnard.    Cardiac history:  A nuclear perfusion study in 2014 revealed normal perfusion without scar or ischemia. An echo Doppler study confirmed systolic ejection fraction at 39-34% and he had grade 1 diastolic dysfunction. A follow-up echo Doppler study subsequent to his Covid infection on January 28, 2020  normal EF at 55 to 60%, mild LVH, grade 2 diastolic dysfunction.   Today: Made appointment because palpitations were becoming more bothersome. One episode lasted 30 minutes, another lasted 2 hours. Now he hasn't had any symptoms in about two months.   Has lost about 10 lbs in the last month, has been working to lose weight, back to exercising. Plans to start working out more. Generally feels sluggish, is concerned it is his heart.   He is very stressed today. 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  ROS: Denies chest pain, shortness of breath at rest or with normal exertion. No PND, orthopnea, LE edema or unexpected weight gain. No syncope or palpitations. ROS otherwise negative except as noted.   Studies Reviewed: SABRA    EKG:       Physical Exam:   VS:  BP (!) 156/92 (BP Location: Right Arm, Patient Position: Sitting, Cuff Size: Large)   Pulse 90   Ht 5' 10.5 (1.791 m)   Wt 278 lb (126.1 kg)   SpO2 99%   BMI 39.33 kg/m    Wt Readings from Last 3 Encounters:  11/06/24 278 lb (126.1 kg)  09/22/24 288 lb 6.4 oz (130.8 kg)  01/29/24 291 lb 3.2 oz (132.1 kg)    GEN: Well nourished, well developed in no acute distress HEENT:  Normal, moist mucous membranes NECK: No JVD CARDIAC: regular rhythm, normal S1 and S2, no rubs or gallops. No murmur. VASCULAR: Radial and DP pulses 2+ bilaterally. No carotid bruits RESPIRATORY:  Clear to auscultation without rales, wheezing or rhonchi  ABDOMEN: Soft, non-tender, non-distended MUSCULOSKELETAL:  Ambulates independently SKIN: Warm and dry, no edema NEUROLOGIC:  Alert and oriented x 3. No focal neuro deficits noted. PSYCHIATRIC:  Normal affect    ASSESSMENT AND PLAN: .    Paroxysmal SVT Palpitations -last monitor with brief SVT.  -Had two long episodes a few months ago but none since. No clear triggers -has used Kardia in the past, but it hasn't worked in a while. He is going to look into replacing this. -continue metoprolol  PRN, diltiazem  daily. -recheck echo   Hyperlipidemia -continue rosuvastatin  20mg  daily -on aspirin  81mg  BID per patient preference, we have discussed recommendations and guidelines -followed by PCP   Hypertension -continue losartan 50 mg daily. Has had multiple recent visits where it has been well controlled. Elevated today due to stress.   Mild aortic dilation -recheck echo  OSA on CPAP -continue  CV risk counseling and prevention -recommend heart healthy/Mediterranean diet, with whole grains, fruits, vegetable, fish, lean meats, nuts, and olive oil. Limit salt. -recommend moderate walking, 3-5 times/week for 30-50 minutes each session. Aim for at least 150 minutes/week. Goal should be pace  of 3 miles/hours, or walking 1.5 miles in 30 minutes -recommend avoidance of tobacco products. Avoid excess alcohol.  Dispo: 6 mos or sooner as needed  Signed, Shelda Bruckner, MD   Shelda Bruckner, MD, PhD, [facility]  [facility]  [place]  [facility] at [facility] at [facility] [address] [place], [place] [address] [phone]

## 2024-11-06 NOTE — Patient Instructions (Signed)
 Medication Instructions:  No changes *If you need a refill on your cardiac medications before your next appointment, please call your pharmacy*  Lab Work: none  Testing/Procedures: Your physician has requested that you have an echocardiogram. Echocardiography is a painless test that uses sound waves to create images of your heart. It provides your doctor with information about the size and shape of your heart and how well your heart's chambers and valves are working. This procedure takes approximately one hour. There are no restrictions for this procedure. Please do NOT wear cologne, perfume, aftershave, or lotions (deodorant is allowed). Please arrive 15 minutes prior to your appointment time.  Please note: We ask at that you not bring children with you during ultrasound (echo/ vascular) testing. Due to room size and safety concerns, children are not allowed in the ultrasound rooms during exams. Our front office staff cannot provide observation of children in our lobby area while testing is being conducted. An adult accompanying a patient to their appointment will only be allowed in the ultrasound room at the discretion of the ultrasound technician under special circumstances. We apologize for any inconvenience.   Follow-Up: At [facility], you and your health needs are our priority.  As part of our continuing mission to provide you with exceptional heart care, our providers are all part of one team.  This team includes your primary Cardiologist (physician) and Advanced Practice Providers or APPs (Physician Assistants and Nurse Practitioners) who all work together to provide you with the care you need, when you need it.  Your next appointment:   6 month(s)  Provider:   Shelda Bruckner, MD, Rosaline Bane, NP, or Reche Finder, NP

## 2024-11-16 ENCOUNTER — Encounter (HOSPITAL_BASED_OUTPATIENT_CLINIC_OR_DEPARTMENT_OTHER): Payer: Self-pay | Admitting: Cardiology

## 2024-12-09 ENCOUNTER — Other Ambulatory Visit (HOSPITAL_BASED_OUTPATIENT_CLINIC_OR_DEPARTMENT_OTHER)

## 2024-12-10 ENCOUNTER — Other Ambulatory Visit (HOSPITAL_BASED_OUTPATIENT_CLINIC_OR_DEPARTMENT_OTHER): Payer: Self-pay | Admitting: Family Medicine

## 2024-12-10 DIAGNOSIS — R112 Nausea with vomiting, unspecified: Secondary | ICD-10-CM

## 2024-12-10 DIAGNOSIS — R1013 Epigastric pain: Secondary | ICD-10-CM

## 2024-12-16 ENCOUNTER — Telehealth (HOSPITAL_BASED_OUTPATIENT_CLINIC_OR_DEPARTMENT_OTHER): Payer: Self-pay | Admitting: Nurse Practitioner

## 2024-12-16 ENCOUNTER — Ambulatory Visit (INDEPENDENT_AMBULATORY_CARE_PROVIDER_SITE_OTHER)

## 2024-12-16 DIAGNOSIS — I471 Supraventricular tachycardia, unspecified: Secondary | ICD-10-CM | POA: Diagnosis not present

## 2024-12-16 DIAGNOSIS — I10 Essential (primary) hypertension: Secondary | ICD-10-CM

## 2024-12-16 DIAGNOSIS — I77810 Thoracic aortic ectasia: Secondary | ICD-10-CM | POA: Diagnosis not present

## 2024-12-16 DIAGNOSIS — R002 Palpitations: Secondary | ICD-10-CM

## 2024-12-16 LAB — ECHOCARDIOGRAM COMPLETE
Area-P 1/2: 4.6 cm2
S' Lateral: 2.8 cm

## 2024-12-16 MED ORDER — PERFLUTREN LIPID MICROSPHERE
1.0000 mL | INTRAVENOUS | Status: AC | PRN
  Administered 2024-12-16: 1 mL via INTRAVENOUS

## 2024-12-16 NOTE — Telephone Encounter (Signed)
 During his echo pt asked Marty if this would show his coronary arteries. She told him no they are too small to see on this test but it would give clues to if he needed further testing for his coronary arteries. He stated could someone please call him about the possibility of having a test that shows the coronaries. He has a friend who had an MI needed stents so it is concerning to him to have them checked.I told him I would pass the message along.   I am passing message along for [facility].

## 2024-12-22 ENCOUNTER — Ambulatory Visit (HOSPITAL_COMMUNITY)
Admission: RE | Admit: 2024-12-22 | Discharge: 2024-12-22 | Disposition: A | Discharge status: Home / Self Care | Source: Ambulatory Visit | Attending: Family Medicine | Admitting: Family Medicine

## 2024-12-22 DIAGNOSIS — R112 Nausea with vomiting, unspecified: Secondary | ICD-10-CM | POA: Diagnosis present

## 2024-12-22 DIAGNOSIS — R1013 Epigastric pain: Secondary | ICD-10-CM | POA: Insufficient documentation

## 2024-12-23 ENCOUNTER — Emergency Department (HOSPITAL_COMMUNITY)

## 2024-12-23 ENCOUNTER — Emergency Department (HOSPITAL_COMMUNITY)
Admission: EM | Admit: 2024-12-23 | Discharge: 2024-12-23 | Disposition: A | Discharge status: Home / Self Care | Source: Ambulatory Visit | Attending: Emergency Medicine | Admitting: Emergency Medicine

## 2024-12-23 DIAGNOSIS — Z7982 Long term (current) use of aspirin: Secondary | ICD-10-CM | POA: Diagnosis not present

## 2024-12-23 DIAGNOSIS — N132 Hydronephrosis with renal and ureteral calculous obstruction: Secondary | ICD-10-CM | POA: Diagnosis not present

## 2024-12-23 DIAGNOSIS — K529 Noninfective gastroenteritis and colitis, unspecified: Secondary | ICD-10-CM | POA: Insufficient documentation

## 2024-12-23 DIAGNOSIS — Z79899 Other long term (current) drug therapy: Secondary | ICD-10-CM | POA: Diagnosis not present

## 2024-12-23 DIAGNOSIS — I10 Essential (primary) hypertension: Secondary | ICD-10-CM | POA: Insufficient documentation

## 2024-12-23 DIAGNOSIS — R1033 Periumbilical pain: Secondary | ICD-10-CM | POA: Diagnosis present

## 2024-12-23 DIAGNOSIS — N200 Calculus of kidney: Secondary | ICD-10-CM

## 2024-12-23 LAB — URINALYSIS, ROUTINE W REFLEX MICROSCOPIC
Bacteria, UA: NONE SEEN
Bilirubin Urine: NEGATIVE
Glucose, UA: NEGATIVE mg/dL
Ketones, ur: 5 mg/dL — AB
Leukocytes,Ua: NEGATIVE
Nitrite: NEGATIVE
Protein, ur: 30 mg/dL — AB
Specific Gravity, Urine: 1.024 (ref 1.005–1.030)
pH: 5 (ref 5.0–8.0)

## 2024-12-23 LAB — CBC
HCT: 47.7 % (ref 39.0–52.0)
Hemoglobin: 16.4 g/dL (ref 13.0–17.0)
MCH: 30.7 pg (ref 26.0–34.0)
MCHC: 34.4 g/dL (ref 30.0–36.0)
MCV: 89.2 fL (ref 80.0–100.0)
Platelets: 324 K/uL (ref 150–400)
RBC: 5.35 MIL/uL (ref 4.22–5.81)
RDW: 13.2 % (ref 11.5–15.5)
WBC: 15.5 K/uL — ABNORMAL HIGH (ref 4.0–10.5)
nRBC: 0 % (ref 0.0–0.2)

## 2024-12-23 LAB — COMPREHENSIVE METABOLIC PANEL WITH GFR
ALT: 37 U/L (ref 0–44)
AST: 18 U/L (ref 15–41)
Albumin: 4.6 g/dL (ref 3.5–5.0)
Alkaline Phosphatase: 57 U/L (ref 38–126)
Anion gap: 15 (ref 5–15)
BUN: 29 mg/dL — ABNORMAL HIGH (ref 8–23)
CO2: 22 mmol/L (ref 22–32)
Calcium: 9.9 mg/dL (ref 8.9–10.3)
Chloride: 104 mmol/L (ref 98–111)
Creatinine, Ser: 1.34 mg/dL — ABNORMAL HIGH (ref 0.61–1.24)
GFR, Estimated: 59 mL/min — ABNORMAL LOW
Glucose, Bld: 113 mg/dL — ABNORMAL HIGH (ref 70–99)
Potassium: 3.8 mmol/L (ref 3.5–5.1)
Sodium: 141 mmol/L (ref 135–145)
Total Bilirubin: 0.6 mg/dL (ref 0.0–1.2)
Total Protein: 8.5 g/dL — ABNORMAL HIGH (ref 6.5–8.1)

## 2024-12-23 LAB — LIPASE, BLOOD: Lipase: 23 U/L (ref 11–51)

## 2024-12-23 MED ORDER — ONDANSETRON HCL 4 MG/2ML IJ SOLN
4.0000 mg | Freq: Once | INTRAMUSCULAR | Status: AC | PRN
  Administered 2024-12-23: 4 mg via INTRAVENOUS
  Filled 2024-12-23: qty 2

## 2024-12-23 MED ORDER — METRONIDAZOLE 500 MG PO TABS
500.0000 mg | ORAL_TABLET | Freq: Once | ORAL | Status: AC
  Administered 2024-12-23: 500 mg via ORAL
  Filled 2024-12-23: qty 1

## 2024-12-23 MED ORDER — OXYCODONE-ACETAMINOPHEN 5-325 MG PO TABS: 1.0000 | ORAL_TABLET | Freq: Four times a day (QID) | ORAL | 0 refills | Status: AC | PRN

## 2024-12-23 MED ORDER — IOHEXOL 300 MG/ML  SOLN
100.0000 mL | Freq: Once | INTRAMUSCULAR | Status: AC | PRN
  Administered 2024-12-23: 100 mL via INTRAVENOUS

## 2024-12-23 MED ORDER — METOCLOPRAMIDE HCL 5 MG/ML IJ SOLN
10.0000 mg | Freq: Once | INTRAMUSCULAR | Status: AC
  Administered 2024-12-23: 10 mg via INTRAVENOUS
  Filled 2024-12-23: qty 2

## 2024-12-23 MED ORDER — TAMSULOSIN HCL 0.4 MG PO CAPS
0.4000 mg | ORAL_CAPSULE | Freq: Once | ORAL | Status: AC
  Administered 2024-12-23: 0.4 mg via ORAL
  Filled 2024-12-23: qty 1

## 2024-12-23 MED ORDER — ONDANSETRON 4 MG PO TBDP: 4.0000 mg | ORAL_TABLET | Freq: Three times a day (TID) | ORAL | 0 refills | Status: AC | PRN

## 2024-12-23 MED ORDER — CIPROFLOXACIN HCL 500 MG PO TABS
500.0000 mg | ORAL_TABLET | Freq: Once | ORAL | Status: AC
  Administered 2024-12-23: 500 mg via ORAL
  Filled 2024-12-23: qty 1

## 2024-12-23 MED ORDER — LACTATED RINGERS IV BOLUS
1000.0000 mL | Freq: Once | INTRAVENOUS | Status: AC
  Administered 2024-12-23: 1000 mL via INTRAVENOUS

## 2024-12-23 MED ORDER — METRONIDAZOLE 500 MG PO TABS: 500.0000 mg | ORAL_TABLET | Freq: Two times a day (BID) | ORAL | 0 refills | Status: AC

## 2024-12-23 MED ORDER — HYDROMORPHONE HCL 1 MG/ML IJ SOLN
1.0000 mg | Freq: Once | INTRAMUSCULAR | Status: AC
  Administered 2024-12-23: 1 mg via INTRAVENOUS
  Filled 2024-12-23: qty 1

## 2024-12-23 MED ORDER — CIPROFLOXACIN HCL 500 MG PO TABS: 500.0000 mg | ORAL_TABLET | Freq: Two times a day (BID) | ORAL | 0 refills | Status: AC

## 2024-12-23 MED ORDER — TAMSULOSIN HCL 0.4 MG PO CAPS: 0.4000 mg | ORAL_CAPSULE | Freq: Every day | ORAL | 0 refills | Status: AC

## 2024-12-23 NOTE — ED Provider Notes (Signed)
 " [place] EMERGENCY DEPARTMENT AT [facility] Provider Note   CSN: [id number] Arrival date & time: 12/23/24  1350     Patient presents with: No chief complaint on file.   Devon Griffith is a 64 y.o. male.   HPI Patient presents for abdominal pain, nausea, vomiting.  Medical history includes HTN, HLD, GERD, OSA, nephrolithiasis.  He reports intermittent mild abdominal discomfort and blood in stool for the last several months.  Last night, he had onset of worsened symptoms.  He has had a periumbilical pain, frequent episodes of emesis, and frequent episodes of diarrhea.  He describes presence of bright red blood in both his vomitus as well as his stool.      Prior to Admission medications  Medication Sig Start Date End Date Taking? Authorizing Provider  ciprofloxacin  (CIPRO ) 500 MG tablet Take 1 tablet (500 mg total) by mouth every 12 (twelve) hours for 5 days. 12/23/24 12/28/24 Yes Melvenia Motto, MD  metroNIDAZOLE  (FLAGYL ) 500 MG tablet Take 1 tablet (500 mg total) by mouth 2 (two) times daily for 5 days. 12/23/24 12/28/24 Yes Melvenia Motto, MD  ondansetron  (ZOFRAN -ODT) 4 MG disintegrating tablet Take 1 tablet (4 mg total) by mouth every 8 (eight) hours as needed for nausea or vomiting. 12/23/24  Yes Melvenia Motto, MD  oxyCODONE -acetaminophen  (PERCOCET/ROXICET) 5-325 MG tablet Take 1 tablet by mouth every 6 (six) hours as needed for up to 3 days for severe pain (pain score 7-10). 12/23/24 12/26/24 Yes Melvenia Motto, MD  tamsulosin  (FLOMAX ) 0.4 MG CAPS capsule Take 1 capsule (0.4 mg total) by mouth daily. 12/23/24  Yes Melvenia Motto, MD  aspirin  EC 81 MG tablet Take 81 mg by mouth daily. Swallow whole.    [provider]  B Complex Vitamins (B COMPLEX PO) Take 1 tablet by mouth daily.    [provider]  buPROPion (WELLBUTRIN XL) 300 MG 24 hr tablet Take 300 mg by mouth every morning. 09/13/22   [provider]  diltiazem  (CARTIA  XT) 180 MG 24 hr capsule Take 1 capsule (180 mg  total) by mouth daily. 11/06/24   Lonni Slain, MD  losartan (COZAAR) 50 MG tablet Take 50 mg by mouth daily. 09/24/24   [provider]  metoprolol  tartrate (LOPRESSOR ) 25 MG tablet TAKE ONE TABLET BY MOUTH FOUR TIMES A DAY AS NEEDED FOR PALPITATIONS 09/24/24   Lonni Slain, MD  omeprazole  (PRILOSEC) 20 MG capsule Take 1 capsule (20 mg total) by mouth daily. 07/07/24   Parrett, Madelin RAMAN, NP  oxyCODONE  (OXY IR/ROXICODONE ) 5 MG immediate release tablet Take 1 tablet by mouth every 4 (four) hours as needed.    [provider]  polyethylene glycol (MIRALAX / GLYCOLAX) 17 g packet Take 17 g by mouth daily as needed (constipation). 02/13/24   [provider]  rosuvastatin  (CRESTOR ) 20 MG tablet Take 1 tablet by mouth once daily 07/25/21   Burnard Debby LABOR, MD  tamsulosin  (FLOMAX ) 0.4 MG CAPS capsule Take 0.4 mg by mouth daily.    [provider]  Testosterone  1.62 % GEL Place 20.25 mg onto the skin daily. Patient taking differently: Place 20.25 mg onto the skin as directed. About every three days 09/13/22   [provider]  VITAMIN D  PO Take 1 tablet by mouth daily.    [provider]    Allergies: Bupropion    Review of Systems  Gastrointestinal:  Positive for abdominal pain, blood in stool, diarrhea, nausea and vomiting.  All other systems reviewed  and are negative.   Updated Vital Signs BP (!) 162/92 (BP Location: Left Arm)   Pulse 78   Temp 98.5 F (36.9 C)   Resp 17   SpO2 99%   Physical Exam Vitals and nursing note reviewed.  Constitutional:      General: He is not in acute distress.    Appearance: Normal appearance. He is well-developed. He is not ill-appearing, toxic-appearing or diaphoretic.  HENT:     Head: Normocephalic and atraumatic.     Right Ear: External ear normal.     Left Ear: External ear normal.     Nose: Nose normal.     Mouth/Throat:     Mouth: Mucous membranes are moist.  Eyes:     Extraocular  Movements: Extraocular movements intact.     Conjunctiva/sclera: Conjunctivae normal.  Cardiovascular:     Rate and Rhythm: Normal rate and regular rhythm.  Pulmonary:     Effort: Pulmonary effort is normal. No respiratory distress.  Abdominal:     General: There is no distension.     Palpations: Abdomen is soft.     Tenderness: There is abdominal tenderness.  Musculoskeletal:        General: No swelling. Normal range of motion.     Cervical back: Normal range of motion and neck supple.  Skin:    General: Skin is warm and dry.     Coloration: Skin is not jaundiced or pale.  Neurological:     General: No focal deficit present.     Mental Status: He is alert and oriented to person, place, and time.  Psychiatric:        Mood and Affect: Mood normal.        Behavior: Behavior normal.     (all labs ordered are listed, but only abnormal results are displayed) Labs Reviewed  COMPREHENSIVE METABOLIC PANEL WITH GFR - Abnormal; Notable for the following components:      Result Value   Glucose, Bld 113 (*)    BUN 29 (*)    Creatinine, Ser 1.34 (*)    Total Protein 8.5 (*)    GFR, Estimated 59 (*)    All other components within normal limits  CBC - Abnormal; Notable for the following components:   WBC 15.5 (*)    All other components within normal limits  URINALYSIS, ROUTINE W REFLEX MICROSCOPIC - Abnormal; Notable for the following components:   APPearance CLOUDY (*)    Hgb urine dipstick LARGE (*)    Ketones, ur 5 (*)    Protein, ur 30 (*)    All other components within normal limits  LIPASE, BLOOD    EKG: None  Radiology: CT ABDOMEN PELVIS W CONTRAST Result Date: 12/23/2024 EXAM: CT ABDOMEN AND PELVIS WITH CONTRAST 12/23/2024 08:25:22 PM TECHNIQUE: CT of the abdomen and pelvis was performed with the administration of 100 mL of iohexol  (OMNIPAQUE ) 300 MG/ML solution. Multiplanar reformatted images are provided for review. Automated exposure control, iterative reconstruction,  and/or weight-based adjustment of the mA/kV was utilized to reduce the radiation dose to as low as reasonably achievable. COMPARISON: CT abdomen and pelvis 03/29/2012. CLINICAL HISTORY: Abdominal pain, acute, nonlocalized. FINDINGS: LOWER CHEST: No acute abnormality. LIVER: There is diffuse fatty infiltration of the liver. GALLBLADDER AND BILE DUCTS: Gallbladder is unremarkable. No biliary ductal dilatation. SPLEEN: No acute abnormality. PANCREAS: No acute abnormality. ADRENAL GLANDS: No acute abnormality. KIDNEYS, URETERS AND BLADDER: There is a 5 mm calculus in the mid left ureter. There is mild  left sided hydronephrosis with perinephric fat stranding. There is a 3.1 cm left renal cyst, increased in size from prior. Per consensus, no follow-up is needed for simple Bosniak type 1 and 2 renal cysts, unless the patient has a malignancy history or risk factors. The right kidney appears normal. Urinary bladder is unremarkable. GI AND BOWEL: Stomach demonstrates no acute abnormality. There is circumferential wall thickening and inflammation of the descending colon. The appendix appears normal. There is no bowel obstruction. No pneumatosis. PERITONEUM AND RETROPERITONEUM: No ascites. No free air. VASCULATURE: Aorta is normal in caliber. LYMPH NODES: No lymphadenopathy. REPRODUCTIVE ORGANS: No acute abnormality. BONES AND SOFT TISSUES: No acute osseous abnormality. There are small fat containing inguinal hernias. IMPRESSION: 1.  Nonspecific colitis of the descending colon without pneumatosis or free air. 2. 5 mm calculus in the mid left ureter with mild left sided hydronephrosis and perinephric fat stranding. 3. 3.1 cm left renal cyst, increased in size from prior. 4. Diffuse hepatic steatosis. Electronically signed by: Greig Pique MD 12/23/2024 08:31 PM EST RP Workstation: [id number]   US  Abdomen Limited RUQ (LIVER/GB) Result Date: 12/23/2024 CLINICAL DATA:  Nausea and vomiting.  Evaluate for gallstones. EXAM:  ULTRASOUND ABDOMEN LIMITED RIGHT UPPER QUADRANT COMPARISON:  CT abdomen pelvis 03/29/2012 FINDINGS: Gallbladder: Gallstones: None Sludge: None Gallbladder Wall: Within normal limits Pericholecystic fluid: None Sonographic Murphy's Sign: Negative per technologist Common bile duct: Diameter: 4 mm Liver: Parenchymal echogenicity is diffusely increased. Hepatic contours are normal. No suspicious hepatic lesions. Portal vein is patent with Hepatopetal flow. Other: None. IMPRESSION: 1. Diffuse increased echogenicity of the hepatic parenchyma is a nonspecific indicator of hepatocellular dysfunction, most commonly steatosis. 2. No sonographic abnormality of the gallbladder. No gallstones identified. Electronically Signed   By: Aliene Lloyd M.D.   On: 12/23/2024 09:11     Procedures   Medications Ordered in the ED  ondansetron  (ZOFRAN ) injection 4 mg (4 mg Intravenous Given 12/23/24 1443)  HYDROmorphone  (DILAUDID ) injection 1 mg (1 mg Intravenous Given 12/23/24 2008)  metoCLOPramide  (REGLAN ) injection 10 mg (10 mg Intravenous Given 12/23/24 2008)  lactated ringers  bolus 1,000 mL (1,000 mLs Intravenous Bolus 12/23/24 2012)  iohexol  (OMNIPAQUE ) 300 MG/ML solution 100 mL (100 mLs Intravenous Contrast Given 12/23/24 2016)  ciprofloxacin  (CIPRO ) tablet 500 mg (500 mg Oral Given 12/23/24 2201)  metroNIDAZOLE  (FLAGYL ) tablet 500 mg (500 mg Oral Given 12/23/24 2201)  tamsulosin  (FLOMAX ) capsule 0.4 mg (0.4 mg Oral Given 12/23/24 2201)                                    Medical Decision Making Amount and/or Complexity of Data Reviewed Labs: ordered. Radiology: ordered.  Risk Prescription drug management.   This patient presents to the ED for concern of abdominal pain, nausea, vomiting, this involves an extensive number of treatment options, and is a complaint that carries with it a high risk of complications and morbidity.  The differential diagnosis includes nephrolithiasis, colitis, enteritis, GERD, PUD,  diverticulitis, other intra-abdominal infection   Co morbidities / Chronic conditions that complicate the patient evaluation  HTN, HLD, GERD, OSA, nephrolithiasis   Additional history obtained:  Additional history obtained from EMR External records from outside source obtained and reviewed including patient's wife   Lab Tests:  I Ordered, and personally interpreted labs.  The pertinent results include: Leukocytosis is present.  Creatinine is baseline.  Electrolytes are normal.  There is no evidence of UTI.  Imaging Studies ordered:  I ordered imaging studies including CT of abdomen and pelvis I independently visualized and interpreted imaging which showed nonspecific colitis and descending colon; 5 mm mid left ureteral stone. I agree with the radiologist interpretation   Cardiac Monitoring: / EKG:  The patient was maintained on a cardiac monitor.  I personally viewed and interpreted the cardiac monitored which showed an underlying rhythm of: Sinus rhythm   Problem List / ED Course / Critical interventions / Medication management  Patient presenting for periumbilical abdominal pain, nausea, vomiting, and diarrhea since last night.  He does describe presence of bright red blood in both stool and vomitus.  On arrival in the ED, vital signs notable for hypertension.  On exam, patient appears mildly uncomfortable.  He does have abdominal tenderness present.  Medications ordered for symptomatic relief.  Workup was initiated.  Patient's lab work was notable for leukocytosis.  Imaging studies showed descending colitis as well as a 5 mm mid ureteral stone on the left side.  Patient was informed of these results.  After initial pain and nausea medication, patient feeling much improved.  He was offered admission but prefers to treat his symptoms and colitis at home.  Patient was given antibiotics for empiric treatment of an infectious colitis.  He does have a gastroenterologist that he can  follow-up with.  He also is established with urology given his history of frequent kidney stones.  Patient was discharged in stable condition. I ordered medication including IV fluids for hydration, Dilaudid  for analgesia, Zofran  and Reglan  for nausea, Cipro  and Flagyl  for colitis, Flomax  for nephrolithiasis Reevaluation of the patient after these medicines showed that the patient improved I have reviewed the patients home medicines and have made adjustments as needed  Social Determinants of Health:  Lives at home with wife     Final diagnoses:  Colitis  Kidney stone    ED Discharge Orders          Ordered    ciprofloxacin  (CIPRO ) 500 MG tablet  Every 12 hours        12/23/24 2201    metroNIDAZOLE  (FLAGYL ) 500 MG tablet  2 times daily        12/23/24 2201    ondansetron  (ZOFRAN -ODT) 4 MG disintegrating tablet  Every 8 hours PRN        12/23/24 2201    tamsulosin  (FLOMAX ) 0.4 MG CAPS capsule  Daily        12/23/24 2201    oxyCODONE -acetaminophen  (PERCOCET/ROXICET) 5-325 MG tablet  Every 6 hours PRN        12/23/24 2201               Melvenia Motto, MD 12/23/24 2202  "

## 2024-12-23 NOTE — ED Triage Notes (Addendum)
 C/o hematemesis, melena starting last night around 11pm. Blood in stool off and on for the last few months. Patient currently dry heaving in triage. Does have a hx of kidney stones with similar symptoms, but no blood usually. Some flank pain earlier. Has been using GLP's, none this week.  Had CT yesterday for liver and gallbladder.

## 2024-12-23 NOTE — Discharge Instructions (Addendum)
 Your CT scan showed inflammation of your descending colon as well as a 5 mm kidney stone in the mid left ureter.    For kidney stone, take ibuprofen every 6 hours.  Take narcotic pain medication as needed.  Drink plenty of fluids to help pass stone.  A prescription for a medication called ondansetron  was sent to your pharmacy.  Take this as needed for nausea.  Take tamsulosin  daily, as this can also help pass stone. Follow-up with urology as soon as possible.  Telephone number is below.  For your colitis, take antibiotics as prescribed.  Avoid use of alcohol while you are taking metronidazole .  Follow-up with your gastroenterologist once your symptoms have improved.  Return to the emergency department for any new or worsening symptoms of concern.

## 2024-12-24 ENCOUNTER — Telehealth: Payer: Self-pay

## 2024-12-24 NOTE — Telephone Encounter (Signed)
 Copied from CRM [id number]. Topic: Clinical - Prescription Issue >> Dec 22, 2024 11:45 AM Devon Griffith wrote: Reason for CRM: Patient calling to inform Devon Griffith that Medicare approved him for Zepbound and he is requesting the prescription be sent to:   [facility] [place], [place] - [address] [facility] [address] [address] [place] [address] Phone: [phone] Fax: [phone]    Spoke with patient Devon Griffith, read the AVS - patients states that PCP states that  pulm provider  should review and order Rx. Not her

## 2024-12-24 NOTE — Telephone Encounter (Signed)
 Pt was set for an in-person appt on 01/27/25 at 3pm. Pt verbalized understanding, NFN.

## 2024-12-24 NOTE — Telephone Encounter (Signed)
 Copied from CRM [id number]. Topic: Clinical - Prescription Issue >> Dec 22, 2024 11:45 AM Corean SAUNDERS wrote: Reason for CRM: Patient calling to inform Tammy Parrett that Medicare approved him for Zepbound and he is requesting the prescription be sent to:   [facility] [place], [place] - [address] [facility] [address] [place] [address] Phone: [phone] Fax: [phone]  Called and spoke to pt. Pt states he has been taking a generic glp1 for the last 2 months, but is unsure of the name and I am not seeing anything on his med list. He states he will send a photo of the medication through MyChart message.  Please advise pt's Zepbound request, thank you!

## 2024-12-24 NOTE — Telephone Encounter (Signed)
 Please let him know we will need OV virtual or in person to discuss  He was just in the ER with Colitis, if he is on a GLP-1 it is possible this could have aggravated it. Needs to recover fully from this and then decide on starting Zepbound. Advise to make ov in 3-4 weeks. Should follow up with PCP or GI regarding Colitis to make sure he is okay to begin . Also Kidney function was decreased on ER labs this will need to be rechecked as well.

## 2024-12-26 ENCOUNTER — Other Ambulatory Visit: Payer: Self-pay | Admitting: Urology

## 2024-12-29 ENCOUNTER — Encounter (HOSPITAL_COMMUNITY): Payer: Self-pay

## 2024-12-29 ENCOUNTER — Other Ambulatory Visit: Payer: Self-pay

## 2024-12-29 ENCOUNTER — Encounter (HOSPITAL_COMMUNITY): Admission: RE | Admit: 2024-12-29 | Discharge: 2024-12-29 | Attending: Urology

## 2024-12-29 DIAGNOSIS — K529 Noninfective gastroenteritis and colitis, unspecified: Secondary | ICD-10-CM | POA: Diagnosis not present

## 2024-12-29 DIAGNOSIS — Z01812 Encounter for preprocedural laboratory examination: Secondary | ICD-10-CM | POA: Diagnosis present

## 2024-12-29 DIAGNOSIS — Z01818 Encounter for other preprocedural examination: Secondary | ICD-10-CM

## 2024-12-29 DIAGNOSIS — N132 Hydronephrosis with renal and ureteral calculous obstruction: Secondary | ICD-10-CM | POA: Diagnosis not present

## 2024-12-29 HISTORY — DX: Personal history of urinary calculi: Z87.442

## 2024-12-29 NOTE — Progress Notes (Signed)
 " Case: [id number] Date/Time: 12/31/24 1411   Procedure: CYSTOSCOPY/URETEROSCOPY/HOLMIUM LASER/STENT PLACEMENT (Left) - CYSTOSCOPY/LEFT URETEROSCOPY/HOLMIUM LASER/STENT PLACEMENT   Anesthesia type: General   Diagnosis: Ureteral stone [N20.1]   Pre-op diagnosis: LEFT URETERAL STONE   Location: [facility] ROOM 01 / [facility]   Surgeons: Devon Lonni Righter, MD       DISCUSSION: Devon Griffith is a 64 yo male with PMH of HTN, PSVT, HFpEF, OSA on CPAP, GERD, hiatal hernia, kidney stones.  Seen in the ED on 1/6 for blood in the stool with abdominal pain. CT A/P was obtained which showed nonspecific colitis as well as left sided obstructing stone. He was treated with antibiotics and advised to f/u with GI and urology.  Patient follows with Cardiology for hx of PSVT. He saw Dr. Lonni on 11/06/24 for worsening palpitations. It was recommended to use metoprolol  prn and diltiazem  daily. Updated echo recommended and done on 12/30 which showed normal LVEF 60-65%, moderate calcification of AV cusps and valve without aortic stenosis.   LD Zepbound 1/5  VS:  Wt Readings from Last 3 Encounters:  11/06/24 126.1 kg  09/22/24 130.8 kg  01/29/24 132.1 kg   Temp Readings from Last 3 Encounters:  12/23/24 36.9 C  09/22/24 36.9 C  04/14/24 36.8 C (Oral)   BP Readings from Last 3 Encounters:  12/23/24 (!) 162/92  11/06/24 (!) 142/88  09/22/24 118/70   Pulse Readings from Last 3 Encounters:  12/23/24 78  11/06/24 90  09/22/24 76     PROVIDERS: Sun, Vyvyan, MD   LABS: WBC elevated from ED visit, treated with abx. Hgb normal. Mildly decreased kidney function, stable from 8 months ago.   CT Abdomen/Pelvis 12/23/24:  IMPRESSION: 1.  Nonspecific colitis of the descending colon without pneumatosis or free air. 2. 5 mm calculus in the mid left ureter with mild left sided hydronephrosis and perinephric fat stranding. 3. 3.1 cm left renal cyst, increased in size from prior. 4. Diffuse hepatic  steatosis.   Echo 12/16/24:  IMPRESSIONS    1. Left ventricular ejection fraction, by estimation, is 60 to 65%. Left ventricular ejection fraction by 3D volume is 61 %. The left ventricle has normal function. The left ventricle has no regional wall motion abnormalities. Left ventricular diastolic  parameters were normal. The average left ventricular global longitudinal strain is -16.0 %. The global longitudinal strain is normal.  2. Right ventricular systolic function is normal. The right ventricular size is mildly enlarged. There is normal pulmonary artery systolic pressure. The estimated right ventricular systolic pressure is 18.1 mmHg.  3. The mitral valve is normal in structure. No evidence of mitral valve regurgitation. No evidence of mitral stenosis.  4. There is moderate calcification of the AV cusps with fusion of the right and left coronary cusps.. The aortic valve is tricuspid. There is moderate calcification of the aortic valve. There is moderate thickening of the aortic valve. Aortic valve regurgitation is not visualized. Aortic valve sclerosis/calcification is present, without any evidence of aortic stenosis.  5. Aortic dilatation noted. There is mild dilatation of the ascending aorta, measuring 39 mm.  6. The inferior vena cava is normal in size with greater than 50% respiratory variability, suggesting right atrial pressure of 3 mmHg. Past Medical History:  Diagnosis Date   Achilles tendon disorder    Left   Arrhythmia    wore monitor 2-3 weeks.aprox 5 years ago.   GERD (gastroesophageal reflux disease) 04/12/2020   H/O hiatal hernia    hx of  History of kidney stones    Hyperlipidemia    Hypertension    Kidney stones    Knee pain    Pain in limb    Sleep apnea    CPAP  settings at 10    Urinary frequency     Past Surgical History:  Procedure Laterality Date   ACHILLES TENDON SURGERY Left 05/27/2019   Procedure: Left Achilles tendon reconstruction;   Surgeon: Devon Rush, MD;  Location: [facility] OR;  Service: Orthopedics;  Laterality: Left;   ANKLE SURGERY     EXTRACORPOREAL SHOCK WAVE LITHOTRIPSY Left 10/14/2018   Procedure: LEFT EXTRACORPOREAL SHOCK WAVE LITHOTRIPSY (ESWL);  Surgeon: Devon Lonni Righter, MD;  Location: [facility];  Service: Urology;  Laterality: Left;   HERNIA REPAIR  2000   KNEE ARTHROSCOPY W/ ACL RECONSTRUCTION  2002   left knee   LITHOTRIPSY  2002   REPLACEMENT TOTAL KNEE BILATERAL Bilateral    VARICOSE VEIN SURGERY  2013   [facility]    MEDICATIONS:  Ascorbic Acid (VITAMIN C PO)   aspirin  EC 81 MG tablet   B Complex Vitamins (B COMPLEX PO)   buPROPion (WELLBUTRIN XL) 150 MG 24 hr tablet   diltiazem  (CARTIA  XT) 180 MG 24 hr capsule   losartan (COZAAR) 50 MG tablet   metoprolol  tartrate (LOPRESSOR ) 25 MG tablet   ondansetron  (ZOFRAN -ODT) 4 MG disintegrating tablet   oxyCODONE  (OXY IR/ROXICODONE ) 5 MG immediate release tablet   pantoprazole (PROTONIX) 40 MG tablet   polyethylene glycol (MIRALAX / GLYCOLAX) 17 g packet   rosuvastatin  (CRESTOR ) 20 MG tablet   tamsulosin  (FLOMAX ) 0.4 MG CAPS capsule   tamsulosin  (FLOMAX ) 0.4 MG CAPS capsule   Testosterone  1.62 % GEL   TIRZEPATIDE [place]   VITAMIN D  PO   No current facility-administered medications for this encounter.   Devon Griffith Phone [phone] 12/30/2024 8:38 AM         "

## 2024-12-29 NOTE — Patient Instructions (Signed)
 SURGICAL WAITING ROOM VISITATION  Patients having surgery or a procedure may have no more than 2 support people in the waiting area - these visitors may rotate.    Children ages 33 and under will not be able to visit patients in [facility] under most circumstances.   Visitors with respiratory illnesses are discouraged from visiting and should remain at home.  If the patient needs to stay at the hospital during part of their recovery, the visitor guidelines for inpatient rooms apply. Pre-op nurse will coordinate an appropriate time for 1 support person to accompany patient in pre-op.  This support person may not rotate.    Please refer to the [facility] website for the visitor guidelines for Inpatients (after your surgery is over and you are in a regular room).       Your procedure is scheduled on:  12/31/2024    Report to [facility] Main Entrance    Report to admitting at   1215 pm    Call this number if you have problems the morning of surgery [phone]   Do not eat food :After Midnight.   After Midnight you may have the following liquids until _ 1115_____ AM  DAY OF SURGERY  Water Non-Citrus Juices (without pulp, NO RED-Apple, White grape, White cranberry) Black Coffee (NO MILK/CREAM OR CREAMERS, sugar ok)  Clear Tea (NO MILK/CREAM OR CREAMERS, sugar ok) regular and decaf                             Plain Jell-O (NO RED)                                           Fruit ices (not with fruit pulp, NO RED)                                     Popsicles (NO RED)                                                               Sports drinks like Gatorade (NO RED)                           If you have questions, please contact your surgeons office.       Oral Hygiene is also important to reduce your risk of infection.                                    Remember - BRUSH YOUR TEETH THE MORNING OF SURGERY WITH YOUR REGULAR TOOTHPASTE  DENTURES WILL BE  REMOVED PRIOR TO SURGERY PLEASE DO NOT APPLY Poly grip OR ADHESIVES!!!   Do NOT smoke after Midnight   Stop all vitamins and herbal supplements 7 days before surgery.   Take these medicines the morning of surgery with A SIP OF WATER:  wellbutrin, diltiazem , protonix, flomax    DO NOT TAKE ANY ORAL DIABETIC MEDICATIONS DAY OF YOUR  SURGERY  Bring CPAP mask and tubing day of surgery.                              You may not have any metal on your body including hair pins, jewelry, and body piercing             Do not wear make-up, lotions, powders, perfumes/cologne, or deodorant  Do not wear nail polish including gel and S&S, artificial/acrylic nails, or any other type of covering on natural nails including finger and toenails. If you have artificial nails, gel coating, etc. that needs to be removed by a nail salon please have this removed prior to surgery or surgery may need to be canceled/ delayed if the surgeon/ anesthesia feels like they are unable to be safely monitored.   Do not shave  48 hours prior to surgery.               Men may shave face and neck.   Do not bring valuables to the hospital. [facility] IS NOT             RESPONSIBLE   FOR VALUABLES.   Contacts, glasses, dentures or bridgework may not be worn into surgery.   Bring small overnight bag day of surgery.   DO NOT BRING YOUR HOME MEDICATIONS TO THE HOSPITAL. PHARMACY WILL DISPENSE MEDICATIONS LISTED ON YOUR MEDICATION LIST TO YOU DURING YOUR ADMISSION IN THE HOSPITAL!    Patients discharged on the day of surgery will not be allowed to drive home.  Someone NEEDS to stay with you for the first 24 hours after anesthesia.   Special Instructions: Bring a copy of your healthcare power of attorney and living will documents the day of surgery if you haven't scanned them before.              Please read over the following fact sheets you were given: IF YOU HAVE QUESTIONS ABOUT YOUR PRE-OP INSTRUCTIONS PLEASE CALL  [phone].   If you received a COVID test during your pre-op visit  it is requested that you wear a mask when out in public, stay away from anyone that may not be feeling well and notify your surgeon if you develop symptoms. If you test positive for Covid or have been in contact with anyone that has tested positive in the last 10 days please notify you surgeon.    [facility] - Preparing for Surgery Before surgery, you can play an important role.  Because skin is not sterile, your skin needs to be as free of germs as possible.  You can reduce the number of germs on your skin by washing with CHG (chlorahexidine gluconate) soap before surgery.  CHG is an antiseptic cleaner which kills germs and bonds with the skin to continue killing germs even after washing. Please DO NOT use if you have an allergy to CHG or antibacterial soaps.  If your skin becomes reddened/irritated stop using the CHG and inform your nurse when you arrive at Short Stay. Do not shave (including legs and underarms) for at least 48 hours prior to the first CHG shower.  You may shave your face/neck. Please follow these instructions carefully:  1.  Shower with CHG Soap the night before surgery and the  morning of Surgery.  2.  If you choose to wash your hair, wash your hair first as usual with your  normal  shampoo.  3.  After  you shampoo, rinse your hair and body thoroughly to remove the  shampoo.                           4.  Use CHG as you would any other liquid soap.  You can apply chg directly  to the skin and wash                       Gently with a scrungie or clean washcloth.  5.  Apply the CHG Soap to your body ONLY FROM THE NECK DOWN.   Do not use on face/ open                           Wound or open sores. Avoid contact with eyes, ears mouth and genitals (private parts).                       Wash face,  Genitals (private parts) with your normal soap.             6.  Wash thoroughly, paying special attention to the area where  your surgery  will be performed.  7.  Thoroughly rinse your body with warm water from the neck down.  8.  DO NOT shower/wash with your normal soap after using and rinsing off  the CHG Soap.                9.  Pat yourself dry with a clean towel.            10.  Wear clean pajamas.            11.  Place clean sheets on your bed the night of your first shower and do not  sleep with pets. Day of Surgery : Do not apply any lotions/deodorants the morning of surgery.  Please wear clean clothes to the hospital/surgery center.  FAILURE TO FOLLOW THESE INSTRUCTIONS MAY RESULT IN THE CANCELLATION OF YOUR SURGERY PATIENT SIGNATURE_________________________________  NURSE SIGNATURE__________________________________  ________________________________________________________________________

## 2024-12-29 NOTE — Progress Notes (Addendum)
 Anesthesia Review:  PCP: DR  Austin  Cardiologist :bridgette christopher LOV 11/06/24  Gastro- DR Outlaw   PPM/ ICD: Device Orders: Rep Notified:  Chest x-ray : EKG : 04/14/24  Echo :12/16/2024  Stress test:2014  Cardiac Cath :   Activity level: can do a flight of stairs without difficulty  Sleep Study/ CPAP : has cpap  Fasting Blood Sugar :      / Checks Blood Sugar -- times a day:    Blood Thinner/ Instructions /Last Dose: ASA / Instructions/ Last Dose :    81 mg aspirin     Mounjaro- last dose on 12/22/2024 per pt .    12/23/24- in ED cbc, cmp and u/a done    Mded hx and preop instructions completed on 12/29/24.   PT repor ts on 12/29/2024 he has not had a BM since last Monday.  On 12/22/24.  PT states went to bathroom today and   it was blood .  Hx of colitis.  PT states he just finished 2 antibiotics on 12/29/2024.  Instructed pt to call DR OUTlaw in regards to colitis as soon as we hang up from phone and to also inform DR Winter of colitis issues prior to surgery on 12/31/2024.  PT voiced understanding. Burnard Senna made aware.      [facility] Office Manager. On 12/29/2024.  PT called and LVMM on 12/30/2024.   Returned call to pt and pt wanted preop nurse to review preop showers with him and again what meds to take am of surgery.  Instructed pt to take wellbutrin , diltiazem  , protonix, crestor  and flomax  am of surgery.  PT voiced understanding.  PT stated at end of phone call he spoke with DR [facility] office on 12/30/2024 in regards to issue listed above.

## 2024-12-30 NOTE — Anesthesia Preprocedure Evaluation (Signed)
"                                    Anesthesia Evaluation  Patient identified by MRN, date of birth, ID band Patient awake    Reviewed: Allergy & Precautions, NPO status , Patient's Chart, lab work & pertinent test results  Airway Mallampati: III  TM Distance: >3 FB Neck ROM: Full    Dental  (+) Chipped,    Pulmonary sleep apnea and Continuous Positive Airway Pressure Ventilation    Pulmonary exam normal        Cardiovascular hypertension, Pt. on medications and Pt. on home beta blockers Normal cardiovascular exam     Neuro/Psych    GI/Hepatic Neg liver ROS, hiatal hernia,GERD  Medicated and Controlled,,  Endo/Other  negative endocrine ROS    Renal/GU Renal disease     Musculoskeletal negative musculoskeletal ROS (+)    Abdominal  (+) + obese  Peds  Hematology negative hematology ROS (+)   Anesthesia Other Findings LEFT URETERAL STONE  Reproductive/Obstetrics                              Anesthesia Physical Anesthesia Plan  ASA: 3  Anesthesia Plan: General   Post-op Pain Management:    Induction: Intravenous  PONV Risk Score and Plan: 2 and Ondansetron , Dexamethasone , Midazolam  and Treatment may vary due to age or medical condition  Airway Management Planned: LMA  Additional Equipment:   Intra-op Plan:   Post-operative Plan: Extubation in OR  Informed Consent: I have reviewed the patients History and Physical, chart, labs and discussed the procedure including the risks, benefits and alternatives for the proposed anesthesia with the patient or authorized representative who has indicated his/her understanding and acceptance.     Dental advisory given  Plan Discussed with: CRNA  Anesthesia Plan Comments: (PAT note from 1/12)         Anesthesia Quick Evaluation  "

## 2024-12-30 NOTE — Telephone Encounter (Signed)
 See phone note from 12/24/24. Per Madelin Stank, NP, pt will need appt to discuss this. Appt has been made for pt, NFN.

## 2024-12-31 ENCOUNTER — Ambulatory Visit (HOSPITAL_COMMUNITY): Admitting: Anesthesiology

## 2024-12-31 ENCOUNTER — Encounter (HOSPITAL_COMMUNITY): Payer: Self-pay | Admitting: Urology

## 2024-12-31 ENCOUNTER — Ambulatory Visit (HOSPITAL_COMMUNITY)

## 2024-12-31 ENCOUNTER — Ambulatory Visit (HOSPITAL_COMMUNITY)
Admission: RE | Admit: 2024-12-31 | Discharge: 2024-12-31 | Disposition: A | Discharge status: Home / Self Care | Attending: Urology | Admitting: Urology

## 2024-12-31 ENCOUNTER — Encounter (HOSPITAL_COMMUNITY): Payer: Self-pay | Admitting: Medical

## 2024-12-31 ENCOUNTER — Encounter: Admission: RE | Disposition: A | Payer: Self-pay | Attending: Urology

## 2024-12-31 DIAGNOSIS — N201 Calculus of ureter: Secondary | ICD-10-CM | POA: Insufficient documentation

## 2024-12-31 DIAGNOSIS — K219 Gastro-esophageal reflux disease without esophagitis: Secondary | ICD-10-CM | POA: Diagnosis not present

## 2024-12-31 DIAGNOSIS — G4730 Sleep apnea, unspecified: Secondary | ICD-10-CM

## 2024-12-31 DIAGNOSIS — Z01818 Encounter for other preprocedural examination: Secondary | ICD-10-CM

## 2024-12-31 DIAGNOSIS — I10 Essential (primary) hypertension: Secondary | ICD-10-CM | POA: Insufficient documentation

## 2024-12-31 DIAGNOSIS — K449 Diaphragmatic hernia without obstruction or gangrene: Secondary | ICD-10-CM | POA: Insufficient documentation

## 2024-12-31 HISTORY — PX: CYSTOSCOPY/URETEROSCOPY/HOLMIUM LASER/STENT PLACEMENT: SHX6546

## 2024-12-31 MED ORDER — KETOROLAC TROMETHAMINE 30 MG/ML IJ SOLN
INTRAMUSCULAR | Status: AC
  Filled 2024-12-31: qty 1

## 2024-12-31 MED ORDER — ACETAMINOPHEN 500 MG PO TABS
1000.0000 mg | ORAL_TABLET | Freq: Once | ORAL | Status: AC
  Administered 2024-12-31: 1000 mg via ORAL
  Filled 2024-12-31: qty 2

## 2024-12-31 MED ORDER — KETOROLAC TROMETHAMINE 10 MG PO TABS: 10.0000 mg | ORAL_TABLET | Freq: Four times a day (QID) | ORAL | 0 refills | Status: AC | PRN

## 2024-12-31 MED ORDER — FENTANYL CITRATE (PF) 100 MCG/2ML IJ SOLN
INTRAMUSCULAR | Status: DC | PRN
  Administered 2024-12-31: 25 ug via INTRAVENOUS
  Administered 2024-12-31: 50 ug via INTRAVENOUS
  Administered 2024-12-31: 25 ug via INTRAVENOUS
  Administered 2024-12-31: 50 ug via INTRAVENOUS

## 2024-12-31 MED ORDER — SODIUM CHLORIDE 0.9 % IR SOLN
Status: DC | PRN
  Administered 2024-12-31: 3000 mL

## 2024-12-31 MED ORDER — FENTANYL CITRATE (PF) 50 MCG/ML IJ SOSY
25.0000 ug | PREFILLED_SYRINGE | INTRAMUSCULAR | Status: DC | PRN
  Administered 2024-12-31: 50 ug via INTRAVENOUS

## 2024-12-31 MED ORDER — EPHEDRINE SULFATE-NACL 50-0.9 MG/10ML-% IV SOSY
PREFILLED_SYRINGE | INTRAVENOUS | Status: DC | PRN
  Administered 2024-12-31: 10 mg via INTRAVENOUS

## 2024-12-31 MED ORDER — AMISULPRIDE (ANTIEMETIC) 5 MG/2ML IV SOLN: 10.0000 mg | Freq: Once | INTRAVENOUS | Status: DC | PRN

## 2024-12-31 MED ORDER — CEFAZOLIN SODIUM-DEXTROSE 3-4 GM/150ML-% IV SOLN
3.0000 g | INTRAVENOUS | Status: AC
  Administered 2024-12-31: 3 g via INTRAVENOUS
  Filled 2024-12-31: qty 150

## 2024-12-31 MED ORDER — OXYBUTYNIN CHLORIDE 5 MG PO TABS: 5.0000 mg | ORAL_TABLET | Freq: Three times a day (TID) | ORAL | 1 refills | Status: AC | PRN

## 2024-12-31 MED ORDER — PROPOFOL 10 MG/ML IV BOLUS
INTRAVENOUS | Status: DC | PRN
  Administered 2024-12-31: 200 mg via INTRAVENOUS

## 2024-12-31 MED ORDER — ONDANSETRON HCL 4 MG/2ML IJ SOLN
INTRAMUSCULAR | Status: DC | PRN
  Administered 2024-12-31: 4 mg via INTRAVENOUS

## 2024-12-31 MED ORDER — OXYCODONE HCL 5 MG PO TABS
5.0000 mg | ORAL_TABLET | Freq: Once | ORAL | Status: AC | PRN
  Administered 2024-12-31: 5 mg via ORAL

## 2024-12-31 MED ORDER — KETOROLAC TROMETHAMINE 30 MG/ML IJ SOLN
INTRAMUSCULAR | Status: DC | PRN
  Administered 2024-12-31: 15 mg via INTRAVENOUS

## 2024-12-31 MED ORDER — FENTANYL CITRATE (PF) 100 MCG/2ML IJ SOLN
INTRAMUSCULAR | Status: AC
  Filled 2024-12-31: qty 2

## 2024-12-31 MED ORDER — ONDANSETRON HCL 4 MG/2ML IJ SOLN: 4.0000 mg | Freq: Once | INTRAMUSCULAR | Status: DC | PRN

## 2024-12-31 MED ORDER — ONDANSETRON HCL 4 MG/2ML IJ SOLN
INTRAMUSCULAR | Status: AC
  Filled 2024-12-31: qty 2

## 2024-12-31 MED ORDER — OXYCODONE HCL 5 MG PO TABS
ORAL_TABLET | ORAL | Status: AC
  Filled 2024-12-31: qty 1

## 2024-12-31 MED ORDER — CHLORHEXIDINE GLUCONATE 0.12 % MT SOLN
15.0000 mL | Freq: Once | OROMUCOSAL | Status: AC
  Administered 2024-12-31: 15 mL via OROMUCOSAL

## 2024-12-31 MED ORDER — LIDOCAINE 2% (20 MG/ML) 5 ML SYRINGE
INTRAMUSCULAR | Status: DC | PRN
  Administered 2024-12-31: 60 mg via INTRAVENOUS

## 2024-12-31 MED ORDER — FENTANYL CITRATE (PF) 50 MCG/ML IJ SOSY
PREFILLED_SYRINGE | INTRAMUSCULAR | Status: AC
  Filled 2024-12-31: qty 2

## 2024-12-31 MED ORDER — OXYCODONE-ACETAMINOPHEN 5-325 MG PO TABS: 1.0000 | ORAL_TABLET | ORAL | 0 refills | Status: AC | PRN

## 2024-12-31 MED ORDER — MIDAZOLAM HCL 2 MG/2ML IJ SOLN
INTRAMUSCULAR | Status: AC
  Filled 2024-12-31: qty 2

## 2024-12-31 MED ORDER — ORAL CARE MOUTH RINSE: 15.0000 mL | Freq: Once | OROMUCOSAL | Status: AC

## 2024-12-31 MED ORDER — DEXAMETHASONE SOD PHOSPHATE PF 10 MG/ML IJ SOLN
INTRAMUSCULAR | Status: AC
  Filled 2024-12-31: qty 1

## 2024-12-31 MED ORDER — CEPHALEXIN 500 MG PO CAPS: 500.0000 mg | ORAL_CAPSULE | Freq: Two times a day (BID) | ORAL | 0 refills | Status: AC

## 2024-12-31 MED ORDER — DEXAMETHASONE SOD PHOSPHATE PF 10 MG/ML IJ SOLN
INTRAMUSCULAR | Status: DC | PRN
  Administered 2024-12-31: 10 mg via INTRAVENOUS

## 2024-12-31 MED ORDER — LACTATED RINGERS IV SOLN: INTRAVENOUS | Status: DC

## 2024-12-31 MED ORDER — PHENAZOPYRIDINE HCL 200 MG PO TABS: 200.0000 mg | ORAL_TABLET | Freq: Three times a day (TID) | ORAL | 0 refills | Status: AC | PRN

## 2024-12-31 MED ORDER — IOHEXOL 300 MG/ML  SOLN
INTRAMUSCULAR | Status: DC | PRN
  Administered 2024-12-31: 20 mL

## 2024-12-31 MED ORDER — PHENYLEPHRINE HCL (PRESSORS) 10 MG/ML IV SOLN
INTRAVENOUS | Status: DC | PRN
  Administered 2024-12-31 (×2): 160 ug via INTRAVENOUS

## 2024-12-31 MED ORDER — MIDAZOLAM HCL (PF) 2 MG/2ML IJ SOLN
INTRAMUSCULAR | Status: DC | PRN
  Administered 2024-12-31: 2 mg via INTRAVENOUS

## 2024-12-31 NOTE — Interval H&P Note (Signed)
 History and Physical Interval Note:  12/31/2024 1:52 PM  Devon Griffith  has presented today for surgery, with the diagnosis of LEFT URETERAL STONE.  The various methods of treatment have been discussed with the patient and family. After consideration of risks, benefits and other options for treatment, the patient has consented to  Procedures with comments: CYSTOSCOPY/URETEROSCOPY/HOLMIUM LASER/STENT PLACEMENT (Left) - CYSTOSCOPY/LEFT URETEROSCOPY/HOLMIUM LASER/STENT PLACEMENT as a surgical intervention.  The patient's history has been reviewed, patient examined, no change in status, stable for surgery.  I have reviewed the patient's chart and labs.  Questions were answered to the patient's satisfaction.     Lonni Righter Kimble Delaurentis

## 2024-12-31 NOTE — Anesthesia Procedure Notes (Signed)
 Procedure Name: LMA Insertion Date/Time: 12/31/2024 2:05 PM  Performed by: Lemario Chaikin, Corean BROCKS, CRNAPre-anesthesia Checklist: Patient identified, Emergency Drugs available, Suction available and Patient being monitored Patient Re-evaluated:Patient Re-evaluated prior to induction Oxygen Delivery Method: Circle system utilized Preoxygenation: Pre-oxygenation with 100% oxygen Induction Type: IV induction Ventilation: Mask ventilation without difficulty LMA: LMA inserted LMA Size: 4.0 Number of attempts: 1 Airway Equipment and Method: Bite block Placement Confirmation: positive ETCO2 Tube secured with: Tape Dental Injury: Teeth and Oropharynx as per pre-operative assessment

## 2024-12-31 NOTE — Transfer of Care (Signed)
 Immediate Anesthesia Transfer of Care Note  Patient: Devon Griffith  Procedure(s) Performed: CYSTOSCOPY/URETEROSCOPY/HOLMIUM LASER/STENT PLACEMENT (Left)  Patient Location: PACU  Anesthesia Type:General  Level of Consciousness: sedated, patient cooperative, and responds to stimulation  Airway & Oxygen Therapy: Patient Spontanous Breathing and Patient connected to face mask oxygen  Post-op Assessment: Report given to RN and Post -op Vital signs reviewed and stable  Post vital signs: Reviewed and stable  Last Vitals:  Vitals Value Taken Time  BP 155/92 12/31/24 15:25  Temp    Pulse 73 12/31/24 15:27  Resp 8 12/31/24 15:27  SpO2 99 % 12/31/24 15:27  Vitals shown include unfiled device data.  Last Pain:  Vitals:   12/31/24 1249  TempSrc:   PainSc: 3          Complications: No notable events documented.

## 2024-12-31 NOTE — Anesthesia Postprocedure Evaluation (Signed)
"   Anesthesia Post Note  Patient: Devon Griffith  Procedure(s) Performed: CYSTOSCOPY/URETEROSCOPY/HOLMIUM LASER/STENT PLACEMENT (Left)     Patient location during evaluation: PACU Anesthesia Type: General Level of consciousness: awake Pain management: pain level controlled Vital Signs Assessment: post-procedure vital signs reviewed and stable Respiratory status: spontaneous breathing, nonlabored ventilation and respiratory function stable Cardiovascular status: blood pressure returned to baseline and stable Postop Assessment: no apparent nausea or vomiting Anesthetic complications: no   No notable events documented.  Last Vitals:  Vitals:   12/31/24 1645 12/31/24 1700  BP: (!) 123/93 131/73  Pulse: 61 68  Resp: 12 15  Temp:  36.5 C  SpO2: 96% 97%    Last Pain:  Vitals:   12/31/24 1700  TempSrc:   PainSc: 0-No pain                 Dawid Dupriest P Shavanna Furnari      "

## 2024-12-31 NOTE — Op Note (Signed)
 Operative Note  Preoperative diagnosis:  1.  5 mm left mid ureteral stone  Postoperative diagnosis: 1.  Obstructing and impacted 5 mm left mid ureteral stone  Procedure(s): 1.  Cystoscopy with left ureteroscopy, holmium laser lithotripsy and left JJ stent placement 2.  Left retrograde pyelogram with intraoperative interpretation of fluoroscopic imaging  Surgeon: Lonni Han, MD  Assistants:  None  Anesthesia:  General  Complications:  None  EBL: Less than 5 mL  Specimens: 1.  Left ureteral stone fragments  Drains/Catheters: 1.  Left 6 French, 26 cm JJ stent without tether  Intraoperative findings:   No intravesical urethral abnormalities were seen Left retrograde pyelogram revealed a filling defect and obstruction of contrast in the midportion of the left ureter, consistent with the obstructing stone seen on recent CT.   Obstructing and impacted 5 mm left mid ureteral stone  Indication:  Devon Griffith is a 64 y.o. male with an obstructing 5 mm left mid ureteral stone.  He has been consented for the above procedures, voices understanding and wishes to proceed.  Description of procedure:  After informed consent was obtained, the patient was brought to the operating room and general LMA anesthesia was administered. The patient was then placed in the dorsolithotomy position and prepped and draped in the usual sterile fashion. A timeout was performed. A 21 French rigid cystoscope was then inserted into the urethral meatus and advanced into the bladder under direct vision. A complete bladder survey revealed no intravesical pathology.  A 5 French ureteral catheter was then inserted into the left ureteral orifice and a retrograde pyelogram was obtained, with the findings listed above.  A Glidewire was then used to intubate the lumen of the ureteral catheter and was advanced up to the left renal pelvis, under fluoroscopic guidance.  The catheter was then removed, leaving the  wire in place.  A semirigid ureteroscope was then advanced up the left ureter to the level of his obstructing and impacted left mid ureteral stone.  A 200 m holmium laser was then used to fracture the stone into several smaller pieces.  A ZeroTip basket was then used to extract all stone fragments from the lumen of the left ureter.  The semirigid ureteroscope was then removed, leaving the Glidewire in place.  The rigid cystoscope was then reinserted into the bladder over the wire.  A 6 French, 26 cm JJ stent was then advanced over the Glidewire and into good position within the left collecting system, confirming placement via fluoroscopy.  The patient's bladder was drained and all stone fragments were evacuated through the sheath of the cystoscope.  He tolerated the procedure well and was transferred to the postanesthesia in stable condition.  Plan: Follow-up in 1 week for office cystoscopy and stent removal

## 2025-01-01 ENCOUNTER — Encounter (HOSPITAL_COMMUNITY): Payer: Self-pay | Admitting: Urology

## 2025-01-05 ENCOUNTER — Ambulatory Visit (HOSPITAL_BASED_OUTPATIENT_CLINIC_OR_DEPARTMENT_OTHER): Payer: Self-pay | Admitting: Cardiology

## 2025-01-27 ENCOUNTER — Ambulatory Visit: Admitting: Adult Health
